# Patient Record
Sex: Male | Born: 1965 | Race: White | Hispanic: No | Marital: Single | Smoking: Current some day smoker
Health system: Southern US, Community
[De-identification: ages and names within clinical notes are randomized; demographics above are authoritative.]

## PROBLEM LIST (undated history)

## (undated) DIAGNOSIS — B182 Chronic viral hepatitis C: Secondary | ICD-10-CM

## (undated) DIAGNOSIS — K219 Gastro-esophageal reflux disease without esophagitis: Secondary | ICD-10-CM

## (undated) DIAGNOSIS — I10 Essential (primary) hypertension: Secondary | ICD-10-CM

## (undated) DIAGNOSIS — F32A Depression, unspecified: Secondary | ICD-10-CM

## (undated) DIAGNOSIS — K429 Umbilical hernia without obstruction or gangrene: Secondary | ICD-10-CM

## (undated) DIAGNOSIS — J069 Acute upper respiratory infection, unspecified: Secondary | ICD-10-CM

## (undated) DIAGNOSIS — R188 Other ascites: Secondary | ICD-10-CM

## (undated) DIAGNOSIS — K7460 Unspecified cirrhosis of liver: Secondary | ICD-10-CM

## (undated) DIAGNOSIS — F329 Major depressive disorder, single episode, unspecified: Secondary | ICD-10-CM

## (undated) HISTORY — PX: OTHER SURGICAL HISTORY: SHX169

## (undated) HISTORY — DX: Other ascites: R18.8

## (undated) HISTORY — DX: Chronic viral hepatitis C: B18.2

## (undated) HISTORY — DX: Unspecified cirrhosis of liver: K74.60

---

## 2008-07-15 ENCOUNTER — Emergency Department (HOSPITAL_COMMUNITY): Admission: EM | Admit: 2008-07-15 | Discharge: 2008-07-16 | Payer: Self-pay | Admitting: Emergency Medicine

## 2011-07-06 LAB — RAPID URINE DRUG SCREEN, HOSP PERFORMED
Amphetamines: NOT DETECTED
Barbiturates: NOT DETECTED
Benzodiazepines: POSITIVE — AB
Cocaine: NOT DETECTED

## 2011-07-06 LAB — POCT I-STAT, CHEM 8
Creatinine, Ser: 0.9
Glucose, Bld: 99
HCT: 51
Hemoglobin: 17.3 — ABNORMAL HIGH
Potassium: 3.6
Sodium: 139
TCO2: 22

## 2011-07-06 LAB — ETHANOL: Alcohol, Ethyl (B): <5

## 2011-07-06 LAB — DIFFERENTIAL
Basophils Absolute: 0.1
Eosinophils Relative: 0
Lymphocytes Relative: 22
Lymphs Abs: 2
Monocytes Absolute: 0.6
Neutro Abs: 6.4

## 2011-07-06 LAB — CBC
HCT: 48.5
Hemoglobin: 16.5
WBC: 9.2

## 2011-08-10 ENCOUNTER — Encounter (INDEPENDENT_AMBULATORY_CARE_PROVIDER_SITE_OTHER): Payer: Self-pay | Admitting: General Surgery

## 2011-08-10 ENCOUNTER — Other Ambulatory Visit (INDEPENDENT_AMBULATORY_CARE_PROVIDER_SITE_OTHER): Payer: Self-pay | Admitting: General Surgery

## 2011-08-10 ENCOUNTER — Ambulatory Visit (INDEPENDENT_AMBULATORY_CARE_PROVIDER_SITE_OTHER): Payer: Medicare PPO | Admitting: General Surgery

## 2011-08-10 VITALS — BP 158/96 | HR 64 | Temp 98.5°F | Resp 18 | Ht 72.0 in | Wt 225.0 lb

## 2011-08-10 DIAGNOSIS — K420 Umbilical hernia with obstruction, without gangrene: Secondary | ICD-10-CM

## 2011-08-10 NOTE — Progress Notes (Signed)
Subjective:     Patient ID: Joe Hayes, male   DOB: 14-Dec-1965, 45 y.o.   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 He went to the emergency room and aspirin a day with  a CT and then he was released and maybe get the week and get the radiologist to pull up the CT -- her so I can review it I do not have a copy of the x-ray report workup is of any lab   Review of Systems Allergies  Allergen Reactions  . Penicillins     hives   History reviewed. No pertinent past surgical history. Current Outpatient Prescriptions  Medication Sig Dispense Refill  . oxyCODONE (OXY IR/ROXICODONE) 5 MG immediate release tablet Take 5 mg by mouth every 4 (four) hours as needed.         BP 158/96  Pulse 64  Temp(Src) 98.5 F (36.9 C) (Temporal)  Resp 18  Ht 6' (1.829 m)  Wt 225 lb (102.059 kg)  BMI 30.52 kg/m2 The patient states he is still smoking he denies any stridor decrease his cigarette consumption and he drinks or regular basis but he says he's never had DTs     Objective:   Physical Exam Physical examination the patient's, rough looking muscular mildly overweight Caucasian male in no acute distress he is not coughing at this time and HEENT pupils are 3 unremarkable he's got decreased breath sounds at little bit of wheeze and occasional of a heavy cigarette user than he admits to. On abdominal exam he is not tender but he's had a small lemon-sized umbilical hernia that you can partially reduce it appears that there is a  defect right above the umbilicus off the supraumbilical component also. On rectal examination there is a moderate amount of stool is Hemoccult-negative in his rectum. Extremities unremarkable C&S is physiologic    Assessment:     Patient has a umbilical hernia this not completely reducible he is out of work for 2 weeks no because of s hernia and I'll recommend that we go ahead and plan on rng the hernia sometime in the near future at [facility] just plan on keeping him overnight since he has nodid be with him on the night of surgery. 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

## 2011-08-10 NOTE — H&P (Signed)
Patient ID: Joe Hayes, male   DOB: August 05, 1966, 45 y.o.   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 He went to the emergency room and aspirin a day with  a CT and then he was released and maybe get the week and get the radiologist to pull up the CT -- her so I can review it I do not have a copy of the x-ray report workup is of any lab   Review of Systems Allergies Allergen Reactions . Penicillins    hives  History reviewed. No pertinent past surgical history. Current Outpatient Prescriptions Medication Sig Dispense Refill . oxyCODONE (OXY IR/ROXICODONE) 5 MG immediate release tablet Take 5 mg by mouth every 4 (four) hours as needed.        BP 158/96  Pulse 64  Temp(Src) 98.5 F (36.9 C) (Temporal)  Resp 18  Ht 6' (1.829 m)  Wt 225 lb (102.059 kg)  BMI 30.52 kg/m2 The patient states he is still smoking he denies any stridor decrease his cigarette consumption and he drinks or regular basis but he says he's never had DTs     Objective:  Physical Exam Physical examination the patient's, rough looking muscular mildly overweight Caucasian male in no acute distress he is not coughing at this time and HEENT pupils are 3 unremarkable he's got decreased breath sounds at little bit of wheeze and occasional of a heavy cigarette user than he admits to. On abdominal exam he is not tender but he's had a small lemon-sized umbilical hernia that you can partially reduce it appears that there is a defect right above the  umbilicus off the supraumbilical component also. On rectal examination there is a moderate amount of stool is Hemoccult-negative in his rectum. Extremities unremarkable C&S is physiologic    Assessment:    Patient has a umbilical hernia this not completely reducible he is out of work for 2 weeks no because of s hernia and I'll recommend that we go ahead and plan on rng the hernia sometime in the near future at [facility] just plan on keeping him overnight since he has nodid be with him on the night of surgery. 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

## 2011-08-11 ENCOUNTER — Encounter (HOSPITAL_COMMUNITY): Payer: Self-pay | Admitting: Pharmacy Technician

## 2011-08-11 ENCOUNTER — Encounter (HOSPITAL_COMMUNITY): Payer: Self-pay | Admitting: *Deleted

## 2011-08-12 ENCOUNTER — Ambulatory Visit (HOSPITAL_COMMUNITY)
Admission: RE | Admit: 2011-08-12 | Discharge: 2011-08-12 | Disposition: A | Discharge status: Home / Self Care | Payer: 59 | Source: Ambulatory Visit | Attending: General Surgery | Admitting: General Surgery

## 2011-08-12 ENCOUNTER — Ambulatory Visit (HOSPITAL_COMMUNITY): Payer: 59 | Admitting: Anesthesiology

## 2011-08-12 ENCOUNTER — Other Ambulatory Visit: Payer: Self-pay

## 2011-08-12 ENCOUNTER — Encounter (HOSPITAL_COMMUNITY): Payer: Self-pay | Admitting: *Deleted

## 2011-08-12 ENCOUNTER — Ambulatory Visit (HOSPITAL_COMMUNITY): Payer: 59

## 2011-08-12 ENCOUNTER — Encounter (HOSPITAL_COMMUNITY): Payer: Self-pay | Admitting: Anesthesiology

## 2011-08-12 ENCOUNTER — Encounter (HOSPITAL_COMMUNITY)
Admission: RE | Disposition: A | Discharge status: Home / Self Care | Payer: Self-pay | Source: Ambulatory Visit | Attending: General Surgery

## 2011-08-12 DIAGNOSIS — K429 Umbilical hernia without obstruction or gangrene: Secondary | ICD-10-CM | POA: Insufficient documentation

## 2011-08-12 DIAGNOSIS — Z01810 Encounter for preprocedural cardiovascular examination: Secondary | ICD-10-CM | POA: Insufficient documentation

## 2011-08-12 DIAGNOSIS — Z01818 Encounter for other preprocedural examination: Secondary | ICD-10-CM | POA: Insufficient documentation

## 2011-08-12 DIAGNOSIS — Z01812 Encounter for preprocedural laboratory examination: Secondary | ICD-10-CM | POA: Insufficient documentation

## 2011-08-12 HISTORY — DX: Depression, unspecified: F32.A

## 2011-08-12 HISTORY — DX: Gastro-esophageal reflux disease without esophagitis: K21.9

## 2011-08-12 HISTORY — DX: Essential (primary) hypertension: I10

## 2011-08-12 HISTORY — DX: Umbilical hernia without obstruction or gangrene: K42.9

## 2011-08-12 HISTORY — DX: Major depressive disorder, single episode, unspecified: F32.9

## 2011-08-12 HISTORY — PX: UMBILICAL HERNIA REPAIR: SHX196

## 2011-08-12 HISTORY — DX: Acute upper respiratory infection, unspecified: J06.9

## 2011-08-12 LAB — CBC
HCT: 47.1 % (ref 39.0–52.0)
MCHC: 35 g/dL (ref 30.0–36.0)
MCV: 94.4 fL (ref 78.0–100.0)
Platelets: 177 10*3/uL (ref 150–400)
RDW: 14.1 % (ref 11.5–15.5)
WBC: 8.9 10*3/uL (ref 4.0–10.5)

## 2011-08-12 LAB — COMPREHENSIVE METABOLIC PANEL
AST: 56 U/L — ABNORMAL HIGH (ref 0–37)
Albumin: 3.7 g/dL (ref 3.5–5.2)
BUN: 15 mg/dL (ref 6–23)
Calcium: 9.4 mg/dL (ref 8.4–10.5)
Creatinine, Ser: 0.83 mg/dL (ref 0.50–1.35)
GFR calc Af Amer: >90 mL/min (ref >90)
GFR calc non Af Amer: >90 mL/min (ref >90)
Total Protein: 8.3 g/dL (ref 6.0–8.3)

## 2011-08-12 LAB — DIFFERENTIAL
Basophils Absolute: 0 10*3/uL (ref 0.0–0.1)
Eosinophils Absolute: 0.1 10*3/uL (ref 0.0–0.7)
Eosinophils Relative: 1 % (ref 0–5)

## 2011-08-12 SURGERY — REPAIR, HERNIA, UMBILICAL, ADULT
Anesthesia: General | Site: Abdomen | Wound class: Clean

## 2011-08-12 MED ORDER — MIDAZOLAM HCL 5 MG/5ML IJ SOLN
INTRAMUSCULAR | Status: DC | PRN
  Administered 2011-08-12: 2 mg via INTRAVENOUS

## 2011-08-12 MED ORDER — BACITRACIN-NEOMYCIN-POLYMYXIN 400-5-5000 EX OINT
TOPICAL_OINTMENT | CUTANEOUS | Status: AC
  Filled 2011-08-12: qty 1

## 2011-08-12 MED ORDER — SODIUM CHLORIDE 0.9 % IR SOLN
Status: DC | PRN
  Administered 2011-08-12: 1000 mL

## 2011-08-12 MED ORDER — HYDROMORPHONE HCL PF 1 MG/ML IJ SOLN
0.2500 mg | INTRAMUSCULAR | Status: DC | PRN
  Administered 2011-08-12: 0.5 mg via INTRAVENOUS
  Administered 2011-08-12 (×2): 0.25 mg via INTRAVENOUS

## 2011-08-12 MED ORDER — BUPIVACAINE-EPINEPHRINE PF 0.25-1:200000 % IJ SOLN
INTRAMUSCULAR | Status: AC
  Filled 2011-08-12: qty 30

## 2011-08-12 MED ORDER — ONDANSETRON HCL 4 MG/2ML IJ SOLN
INTRAMUSCULAR | Status: DC | PRN
  Administered 2011-08-12: 4 mg via INTRAVENOUS

## 2011-08-12 MED ORDER — HYDROMORPHONE HCL PF 1 MG/ML IJ SOLN
INTRAMUSCULAR | Status: AC
  Filled 2011-08-12: qty 1

## 2011-08-12 MED ORDER — ACETAMINOPHEN 10 MG/ML IV SOLN
INTRAVENOUS | Status: DC | PRN
  Administered 2011-08-12: 1000 mg via INTRAVENOUS

## 2011-08-12 MED ORDER — ACETAMINOPHEN 10 MG/ML IV SOLN
INTRAVENOUS | Status: AC
  Filled 2011-08-12: qty 100

## 2011-08-12 MED ORDER — FENTANYL CITRATE 0.05 MG/ML IJ SOLN: 25.0000 ug | INTRAMUSCULAR | Status: DC | PRN

## 2011-08-12 MED ORDER — LACTATED RINGERS IV SOLN
INTRAVENOUS | Status: DC
  Administered 2011-08-12: 1000 mL via INTRAVENOUS

## 2011-08-12 MED ORDER — SUCCINYLCHOLINE CHLORIDE 20 MG/ML IJ SOLN
INTRAMUSCULAR | Status: DC | PRN
  Administered 2011-08-12: 100 mg via INTRAVENOUS

## 2011-08-12 MED ORDER — BUPIVACAINE-EPINEPHRINE PF 0.25-1:200000 % IJ SOLN
INTRAMUSCULAR | Status: DC | PRN
  Administered 2011-08-12: 30 mL

## 2011-08-12 MED ORDER — DEXAMETHASONE SODIUM PHOSPHATE 10 MG/ML IJ SOLN
INTRAMUSCULAR | Status: DC | PRN
  Administered 2011-08-12: 10 mg via INTRAVENOUS

## 2011-08-12 MED ORDER — NEOSTIGMINE METHYLSULFATE 1 MG/ML IJ SOLN
INTRAMUSCULAR | Status: DC | PRN
  Administered 2011-08-12: 2 mg via INTRAVENOUS

## 2011-08-12 MED ORDER — FENTANYL CITRATE 0.05 MG/ML IJ SOLN
INTRAMUSCULAR | Status: AC
  Filled 2011-08-12: qty 2

## 2011-08-12 MED ORDER — LABETALOL HCL 5 MG/ML IV SOLN
INTRAVENOUS | Status: DC | PRN
  Administered 2011-08-12: 2.5 mg via INTRAVENOUS
  Administered 2011-08-12: 5 mg via INTRAVENOUS

## 2011-08-12 MED ORDER — FENTANYL CITRATE 0.05 MG/ML IJ SOLN
50.0000 ug | INTRAMUSCULAR | Status: DC | PRN
  Administered 2011-08-12 (×2): 50 ug via INTRAVENOUS

## 2011-08-12 MED ORDER — CIPROFLOXACIN IN D5W 400 MG/200ML IV SOLN
INTRAVENOUS | Status: AC
  Filled 2011-08-12: qty 200

## 2011-08-12 MED ORDER — HYDROCODONE-ACETAMINOPHEN 5-325 MG PO TABS
ORAL_TABLET | ORAL | Status: AC
  Administered 2011-08-12: 1 via ORAL
  Filled 2011-08-12: qty 1

## 2011-08-12 MED ORDER — HYDRALAZINE HCL 20 MG/ML IJ SOLN
INTRAMUSCULAR | Status: DC | PRN
  Administered 2011-08-12: 4 mg via INTRAVENOUS

## 2011-08-12 MED ORDER — GLYCOPYRROLATE 0.2 MG/ML IJ SOLN
INTRAMUSCULAR | Status: DC | PRN
  Administered 2011-08-12: .4 mg via INTRAVENOUS

## 2011-08-12 MED ORDER — ROCURONIUM BROMIDE 100 MG/10ML IV SOLN
INTRAVENOUS | Status: DC | PRN
  Administered 2011-08-12: 10 mg via INTRAVENOUS
  Administered 2011-08-12: 30 mg via INTRAVENOUS

## 2011-08-12 MED ORDER — FENTANYL CITRATE 0.05 MG/ML IJ SOLN
INTRAMUSCULAR | Status: DC | PRN
  Administered 2011-08-12 (×7): 50 ug via INTRAVENOUS

## 2011-08-12 MED ORDER — PROPOFOL 10 MG/ML IV EMUL
INTRAVENOUS | Status: DC | PRN
  Administered 2011-08-12: 200 mg via INTRAVENOUS

## 2011-08-12 MED ORDER — PROMETHAZINE HCL 25 MG/ML IJ SOLN: 6.2500 mg | INTRAMUSCULAR | Status: DC | PRN

## 2011-08-12 MED ORDER — CIPROFLOXACIN IN D5W 400 MG/200ML IV SOLN
400.0000 mg | INTRAVENOUS | Status: AC
  Administered 2011-08-12: 400 mg via INTRAVENOUS
  Filled 2011-08-12: qty 200

## 2011-08-12 MED ORDER — LIDOCAINE HCL (CARDIAC) 20 MG/ML IV SOLN
INTRAVENOUS | Status: DC | PRN
  Administered 2011-08-12: 60 mg via INTRAVENOUS

## 2011-08-12 SURGICAL SUPPLY — 44 items
BENZOIN TINCTURE PRP APPL 2/3 (GAUZE/BANDAGES/DRESSINGS) ×2 IMPLANT
BLADE HEX COATED 2.75 (ELECTRODE) ×2 IMPLANT
BLADE SURG 15 STRL LF DISP TIS (BLADE) ×1 IMPLANT
BLADE SURG 15 STRL SS (BLADE) ×1
BLADE SURG SZ10 CARB STEEL (BLADE) IMPLANT
CLOTH BEACON ORANGE TIMEOUT ST (SAFETY) ×2 IMPLANT
COTTON BALL STERILE (GAUZE/BANDAGES/DRESSINGS)
COTTON BALL STERILE 2 PK (GAUZE/BANDAGES/DRESSINGS) IMPLANT
DECANTER SPIKE VIAL GLASS SM (MISCELLANEOUS) ×2 IMPLANT
DISSECTOR ROUND CHERRY 3/8 STR (MISCELLANEOUS) ×2 IMPLANT
DRAPE LAPAROSCOPIC ABDOMINAL (DRAPES) ×2 IMPLANT
ELECT REM PT RETURN 9FT ADLT (ELECTROSURGICAL) ×2
ELECTRODE REM PT RTRN 9FT ADLT (ELECTROSURGICAL) ×1 IMPLANT
GLOVE BIOGEL PI IND STRL 7.0 (GLOVE) ×1 IMPLANT
GLOVE BIOGEL PI INDICATOR 7.0 (GLOVE) ×1
GLOVE ORTHO TXT STRL SZ7.5 (GLOVE) ×2 IMPLANT
GOWN PREVENTION PLUS XLARGE (GOWN DISPOSABLE) ×2 IMPLANT
GOWN STRL NON-REIN LRG LVL3 (GOWN DISPOSABLE) ×2 IMPLANT
GOWN STRL REIN XL XLG (GOWN DISPOSABLE) ×2 IMPLANT
KIT BASIN OR (CUSTOM PROCEDURE TRAY) ×2 IMPLANT
NEEDLE HYPO 22GX1.5 SAFETY (NEEDLE) ×2 IMPLANT
PACK GENERAL/GYN (CUSTOM PROCEDURE TRAY) ×2 IMPLANT
PATCH VENTRAL MEDIUM 6.4 (Mesh Specialty) ×2 IMPLANT
SPONGE GAUZE 4X4 12PLY (GAUZE/BANDAGES/DRESSINGS) ×2 IMPLANT
SPONGE LAP 18X18 X RAY DECT (DISPOSABLE) ×2 IMPLANT
STRIP CLOSURE SKIN 1/2X4 (GAUZE/BANDAGES/DRESSINGS) ×2 IMPLANT
SUT ETHILON 4 0 PS 2 18 (SUTURE) ×2 IMPLANT
SUT PROLENE 0 CT 1 30 (SUTURE) IMPLANT
SUT PROLENE 0 CT 1 CR/8 (SUTURE) IMPLANT
SUT PROLENE 0 CT 2 (SUTURE) IMPLANT
SUT PROLENE 0 CTX CR/8 (SUTURE) ×2 IMPLANT
SUT SURG 0 T 19/GS 22 1969 62 (SUTURE) ×4 IMPLANT
SUT VIC AB 2-0 SH 18 (SUTURE) ×2 IMPLANT
SUT VIC AB 3-0 54XBRD REEL (SUTURE) ×1 IMPLANT
SUT VIC AB 3-0 BRD 54 (SUTURE) ×1
SUT VIC AB 3-0 SH 18 (SUTURE) ×2 IMPLANT
SUT VIC AB 3-0 SH 27 (SUTURE)
SUT VIC AB 3-0 SH 27XBRD (SUTURE) IMPLANT
SUT VIC AB 4-0 PS2 27 (SUTURE) ×2 IMPLANT
SUT VICRYL 2 0 18  UND BR (SUTURE)
SUT VICRYL 2 0 18 UND BR (SUTURE) IMPLANT
SYR BULB IRRIGATION 50ML (SYRINGE) IMPLANT
SYR CONTROL 10ML LL (SYRINGE) ×2 IMPLANT
TAPE CLOTH SURG 4X10 WHT LF (GAUZE/BANDAGES/DRESSINGS) ×2 IMPLANT

## 2011-08-12 NOTE — Op Note (Signed)
Umbilical Herniorrhaphy Procedure Note  Indications: Symptomatic umbilical hernia  Pre-operative Diagnosis: umbilical hernia  Post-operative Diagnosis: umbilical hernia  Surgeon: Iona Coach   Anesthesiologist: Gaetano Hawthorne, MD  Anesthesia: General endotracheal anesthesia  ASA Class: 1  Brief History:  Patient is a 45 year old moderately overweight male. 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  He said recently had an episode of coughing and that coworkers where he worked had the flu, and he got sick with coughing and upper respiratory type problems.  Despite his umbilical hernia with multiple symptoms, patient states he's never had a colonoscopy.  On examination in office, I cannot find any blood in his stool on Hemoccult test. He had an emergency room visit and was placed on daily aspirin.  Procedure Details  The patient was seen in the Holding Room. The risks, benefits, complications, treatment options, and expected outcomes were discussed with the patient. The possibilities of reaction to medication, pulmonary aspiration, perforation of viscus, bleeding, recurrent infection, the need for additional procedures, failure to diagnose a condition, and creating a complication requiring transfusion or operation were discussed with the patient. The patient concurred with the proposed plan, giving informed consent.  The site of surgery properly noted/marked. The patient was taken to Operating Room # 11, identified as Joe Hayes and the procedure verified as Umbilical Herniorrhaphy. A Time Out was held and the above information confirmed.  The patient was placed supine.  After establishing general anesthesia, the abdomen was prepped and draped in standard fashion.  0.50% Marcaine with epinephrine was used to anesthetize the skin.  A periumbilical incision was created.  Dissection was carried down to the hernia sac located  above the fascia and was mobilized from surrounding structures.  Intact fascia was identified circumferentially around the defect.  He had a supraumbilical defect also with incarcerated omentum which was reduced, and the incarcerated omentum removed.  Pedicles tied with 2-0 vicryl.  A medium size Proceed ventral patch was used. It was sutured at edges with 0 prolene interrupted sutures. The mesh was under all the fascia defects. The umbilical defect was closed with 0 surgelon sutures.   The soft tissue was irrigated and closed in layers.  Hemostasis was confirmed.  The skin incision was closed in layers with a 4-0 Vicryl subcuticular closure and 4-0 nylon sutures.    Instrument, sponge, and needle counts were correct prior to closure and at the conclusion of the case.   Estimated Blood Loss:  Minimal                 Specimens: Tissue trimmings, not sent to pathology         Implants:   Implant Name Type Inv. Item Serial No. Manufacturer Lot No. LRB No. Used  PATCH VENTRAL MEDIUM 6.4 - [id number] [employer] Specialty PATCH VENTRAL MEDIUM 6.4     [id number] N/A 1   Complications:  None; patient tolerated the procedure well.         Disposition: PACU - hemodynamically stable.         Condition: stable  Attending Attestation: I was present and scrubbed for the entire procedure.

## 2011-08-12 NOTE — Preoperative (Signed)
Beta Blockers   Reason not to administer Beta Blockers:Not Applicable 

## 2011-08-12 NOTE — Interval H&P Note (Signed)
History and Physical Interval Note:   08/12/2011   12:53 PM   Joe Hayes  has presented today for surgery, with the diagnosis of incarerated umbilical hernia  The various methods of treatment have been discussed with the patient. After consideration of risks, benefits and other options for treatment, the patient has consented to  Procedure(s): HERNIA REPAIR UMBILICAL ADULT as a surgical intervention .  The patients' history has been reviewed, patient examined, no change in status, stable for surgery.  I have reviewed the patients' chart and labs.  Questions were answered to the patient's satisfaction.   Hernia still incarcareted, no generalized abd pain. Chest xray done  BP 158/116  Pulse 90  Temp 97.8 F (36.6 C)  Resp 20  Ht 6' (1.829 m)  Wt 219 lb 4 oz (99.451 kg)  BMI 29.74 kg/m2  SpO2 97%   Iona Coach  MD

## 2011-08-12 NOTE — Progress Notes (Signed)
Short stay complete, pt transferred to Cxray then to OR holding

## 2011-08-12 NOTE — Anesthesia Preprocedure Evaluation (Addendum)
Anesthesia Evaluation  Patient identified by MRN, date of birth, ID band Patient awake    Reviewed: Allergy & Precautions, H&P , NPO status   History of Anesthesia Complications Negative for: history of anesthetic complications  Airway Mallampati: III TM Distance: >3 FB Neck ROM: Full    Dental  (+) Chipped and Dental Advisory Given,    Pulmonary Recent URI , Resolved, Current Smoker,    Pulmonary exam normal       Cardiovascular hypertension, - anginaneg cardio ROS     Neuro/Psych Negative Neurological ROS  Negative Psych ROS   GI/Hepatic negative GI ROS, Neg liver ROS, GERD-  Controlled,  Endo/Other  Negative Endocrine ROS  Renal/GU negative Renal ROS  Genitourinary negative   Musculoskeletal negative musculoskeletal ROS (+)   Abdominal Normal abdominal exam  (+)   Peds negative pediatric ROS (+)  Hematology negative hematology ROS (+)   Anesthesia Other Findings   Reproductive/Obstetrics negative OB ROS                        Anesthesia Physical Anesthesia Plan  ASA: II  Anesthesia Plan: General   Post-op Pain Management:    Induction: Intravenous  Airway Management Planned: Oral ETT  Additional Equipment:   Intra-op Plan:   Post-operative Plan:   Informed Consent: I have reviewed the patients History and Physical, chart, labs and discussed the procedure including the risks, benefits and alternatives for the proposed anesthesia with the patient or authorized representative who has indicated his/her understanding and acceptance.   Dental advisory given  Plan Discussed with: CRNA and Surgeon  Anesthesia Plan Comments:         Anesthesia Quick Evaluation

## 2011-08-12 NOTE — H&P (View-Only) (Signed)
Patient ID: Joe Hayes, male   DOB: 05/13/1966, 45 y.o.   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 He went to the emergency room and aspirin a day with  a CT and then he was released and maybe get the week and get the radiologist to pull up the CT -- her so I can review it I do not have a copy of the x-ray report workup is of any lab   Review of Systems Allergies Allergen Reactions . Penicillins    hives  History reviewed. No pertinent past surgical history. Current Outpatient Prescriptions Medication Sig Dispense Refill . oxyCODONE (OXY IR/ROXICODONE) 5 MG immediate release tablet Take 5 mg by mouth every 4 (four) hours as needed.        BP 158/96  Pulse 64  Temp(Src) 98.5 F (36.9 C) (Temporal)  Resp 18  Ht 6' (1.829 m)  Wt 225 lb (102.059 kg)  BMI 30.52 kg/m2 The patient states he is still smoking he denies any stridor decrease his cigarette consumption and he drinks or regular basis but he says he's never had DTs     Objective:  Physical Exam Physical examination the patient's, rough looking muscular mildly overweight Caucasian male in no acute distress he is not coughing at this time and HEENT pupils are 3 unremarkable he's got decreased breath sounds at little bit of wheeze and occasional of a heavy cigarette user than he admits to. On abdominal exam he is not tender but he's had a small lemon-sized umbilical hernia that you can partially reduce it appears that there is a defect right above the  umbilicus off the supraumbilical component also. On rectal examination there is a moderate amount of stool is Hemoccult-negative in his rectum. Extremities unremarkable C&S is physiologic    Assessment:    Patient has a umbilical hernia this not completely reducible he is out of work for 2 weeks no because of s hernia and I'll recommend that we go ahead and plan on rng the hernia sometime in the near future at [place] just plan on keeping him overnight since he has nodid be with him on the night of surgery. 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       

## 2011-08-12 NOTE — Addendum Note (Signed)
Addendum  created 08/12/11 1720 by Gaetano Hawthorne, MD   Modules edited:Orders

## 2011-08-12 NOTE — Transfer of Care (Signed)
Immediate Anesthesia Transfer of Care Note  Patient: Joe Hayes  Procedure(s) Performed:  HERNIA REPAIR UMBILICAL ADULT - repair umbilical hernia with mesh  Patient Location: PACU  Anesthesia Type: General  Level of Consciousness: awake, sedated, patient cooperative, lethargic and responds to stimulation  Airway & Oxygen Therapy: Patient Spontanous Breathing and Patient connected to face mask oxygen  Post-op Assessment: Report given to PACU RN, Post -op Vital signs reviewed and stable and Patient moving all extremities X 4  Post vital signs: Reviewed and stable  Complications: No apparent anesthesia complications

## 2011-08-12 NOTE — Anesthesia Postprocedure Evaluation (Signed)
  Anesthesia Post-op Note  Patient: Joe Hayes  Procedure(s) Performed:  HERNIA REPAIR UMBILICAL ADULT - repair umbilical hernia with mesh  Patient Location: PACU  Anesthesia Type: General  Level of Consciousness: awake and alert   Airway and Oxygen Therapy: Patient Spontanous Breathing  Post-op Pain: mild  Post-op Assessment: Post-op Vital signs reviewed, Patient's Cardiovascular Status Stable, Respiratory Function Stable, Patent Airway and No signs of Nausea or vomiting  Post-op Vital Signs: stable  Complications: No apparent anesthesia complications

## 2011-08-12 NOTE — Addendum Note (Signed)
Addendum  created 08/12/11 1713 by Gaetano Hawthorne, MD   Modules edited:Orders

## 2011-08-14 LAB — MRSA CULTURE

## 2011-08-16 ENCOUNTER — Encounter (HOSPITAL_COMMUNITY): Payer: Self-pay | Admitting: General Surgery

## 2011-08-19 ENCOUNTER — Encounter (INDEPENDENT_AMBULATORY_CARE_PROVIDER_SITE_OTHER): Payer: Self-pay | Admitting: General Surgery

## 2011-08-19 ENCOUNTER — Ambulatory Visit (INDEPENDENT_AMBULATORY_CARE_PROVIDER_SITE_OTHER): Payer: 59 | Admitting: General Surgery

## 2011-08-19 VITALS — BP 142/88 | HR 88 | Temp 98.3°F | Resp 12 | Ht 72.0 in | Wt 220.4 lb

## 2011-08-19 DIAGNOSIS — K429 Umbilical hernia without obstruction or gangrene: Secondary | ICD-10-CM

## 2011-08-19 NOTE — Progress Notes (Signed)
Subjective:     Patient ID: Joe Hayes, male   DOB: [birth year], 45 y.o.   MRN: [id number]  HPIMr. 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 patch and he had a little ischemic area of the overlying skin where it had this chronic course [place] and that appears to be healing satisfactory with just a little bit of superficial exfoliation of the overlying skin he doesn't need a renewal of his pain medication and will plan on seeing the after Thanksgiving I removed the sutures did place Steri-Strips and he should let her get to the area can shower daily starting tomorrow and good bowel movements .   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

## 2011-08-19 NOTE — Patient Instructions (Signed)
Only light dressing may shower daily replace steri strips prn

## 2011-09-06 ENCOUNTER — Encounter (INDEPENDENT_AMBULATORY_CARE_PROVIDER_SITE_OTHER): Payer: Self-pay | Admitting: General Surgery

## 2011-09-06 ENCOUNTER — Ambulatory Visit (INDEPENDENT_AMBULATORY_CARE_PROVIDER_SITE_OTHER): Payer: 59 | Admitting: General Surgery

## 2011-09-06 VITALS — BP 132/88 | HR 72 | Temp 97.9°F | Resp 18 | Ht 72.0 in | Wt 225.2 lb

## 2011-09-06 DIAGNOSIS — K429 Umbilical hernia without obstruction or gangrene: Secondary | ICD-10-CM | POA: Insufficient documentation

## 2011-09-06 NOTE — Progress Notes (Signed)
Patient ID: Joe Hayes, male   DOB: September 26, 1966, 45 y.o.   MRN: [id number] Mr. Sleight returns she's now 4 weeks following his umbilical hernia repair with a medium size ventral patch kit and I had seen him and remove the sutures but he had a little bit of thin skin were trimmed and port of the umbilicus and a little area has separated. He's got very dry scan all over his body especially in the wintertime and a scissor used to other cream to try to soften this up. There is no evidence of any problem with infection no drainage from the labile and he's got some return to work slips I think if long as he is not listed over 40 pounds he could return to work blood or whether his employer has worked for him in the month of December is, if he. I think after 1 January he'll be 2 months he can do anything and everything and he'll see [place] on a p.r.n. basis.  P.vs atient states that his bowels are working well his pain is much less and he is appreciative a wall we've done they will see him on a p.r.n. basis

## 2013-01-02 ENCOUNTER — Encounter: Payer: Self-pay | Admitting: Vascular Surgery

## 2013-01-02 ENCOUNTER — Other Ambulatory Visit: Payer: Self-pay

## 2013-01-02 ENCOUNTER — Other Ambulatory Visit: Payer: Self-pay | Admitting: *Deleted

## 2013-01-02 DIAGNOSIS — I83893 Varicose veins of bilateral lower extremities with other complications: Secondary | ICD-10-CM

## 2013-01-18 ENCOUNTER — Encounter: Payer: Self-pay | Admitting: Vascular Surgery

## 2013-01-22 ENCOUNTER — Encounter (INDEPENDENT_AMBULATORY_CARE_PROVIDER_SITE_OTHER): Payer: 59 | Admitting: *Deleted

## 2013-01-22 ENCOUNTER — Ambulatory Visit (INDEPENDENT_AMBULATORY_CARE_PROVIDER_SITE_OTHER): Payer: 59 | Admitting: Vascular Surgery

## 2013-01-22 ENCOUNTER — Encounter: Payer: Self-pay | Admitting: Vascular Surgery

## 2013-01-22 VITALS — BP 190/119 | HR 80 | Resp 20 | Ht 72.0 in | Wt 226.0 lb

## 2013-01-22 DIAGNOSIS — I83893 Varicose veins of bilateral lower extremities with other complications: Secondary | ICD-10-CM

## 2013-01-22 NOTE — Progress Notes (Signed)
Subjective:     Patient ID: Joe Hayes, male   DOB: 1965-10-24, 47 y.o.   MRN: [id number]  HPI this 47 year old male was referred for severe venous insufficiency both legs. He has had bulging varicosities in both legs over the past several years. He's had multiple episodes of bleeding from the right leg in the past 6 months. This has required trips to the emergency department and the blood has been spurting significantly he states. He has no history of DVT, thrombophlebitis, stasis ulcers, or other complications. He has had scaly skin most of his life in all his extremities with an unknown diagnosis. He does develop swelling in both legs as the day progresses. It is not wear long leg elastic compression stockings nor elevate his legs.  Past Medical History  Diagnosis Date  . Abdominal pain   . Recurrent upper respiratory infection (URI)     uri x 1 month- states improved  . GERD (gastroesophageal reflux disease)   . Hypertension     was on samples years ago- not diagnosed as hypertension  . Depression     states PTSD 4 years ago requiring counceling and states is doing ok  . Umbilical hernia without mention of obstruction or gangrene     History  Substance Use Topics  . Smoking status: Current Some Day Smoker -- 0.50 packs/day for 10 years    Types: Cigarettes  . Smokeless tobacco: Never Used  . Alcohol Use: 4.8 oz/week    8 Shots of liquor per week    Family History  Problem Relation Age of Onset  . Heart disease Father   . Cancer Paternal Uncle     pt unaware of what kind    Allergies  Allergen Reactions  . Penicillins Hives    Occurred in childhood.    Current outpatient prescriptions:HYDROcodone-acetaminophen (VICODIN) 5-500 MG per tablet, Ad lib., Disp: , Rfl:   BP 190/119  Pulse 80  Resp 20  Ht 6' (1.829 m)  Wt 226 lb (102.513 kg)  BMI 30.64 kg/m2  Body mass index is 30.64 kg/(m^2).         Review of Systems he denies chest pain does have a history  of orthopnea and leg discomfort while walking. Also has numbness and weakness in both arms and legs, bleeding problems, fever, chills,  Skin    Objective:   Physical Exam blood pressure 190 of 119 heart rate 80 respirations 20 Gen.-alert and oriented x3 in no apparent distress HEENT normal for age Lungs no rhonchi or wheezing Cardiovascular regular rhythm no murmurs carotid pulses 3+ palpable no bruits audible Abdomen soft nontender no palpable masses Musculoskeletal free of  major deformities Skin --very dry and scaly throughout including chest abdomen upper extremities and lower extremities.  Neurologic normal Lower extremities 3+ femoral and dorsalis pedis pulses palpable bilaterally with 1+ edema bilaterally. Bulging varicosities right leg anterior thigh extending into the lateral calf with evidence of previous bleeding from lateral calf and very superficial reticular veins. Multiple reticular veins surrounding ankle on right. No active ulcers. Left leg bulging varicosities medial thigh of the great saphenous system extending posteriorly into the popliteal fossa with chronic 1+ edema and no active ulcer.  Today I ordered bilateral lower extremity duplex scan which I reviewed and interpreted. There is gross reflux in both great saphenous systems supplying these bulging varicosities. The small saphenous veins are free of reflux bilaterally and there is no DVT.       Assessment:  Severe bilateral venous insufficiency with gross reflux bilateral great saphenous systems with bulging symptomatic varicosities history of multiple stab episodes of bleeding the right leg    Plan:     #1 long-leg elastic compression stockings 20-30 mm gradient #2 elevate legs daily #3 ibuprofen on a daily basis #4 return in 3 months. If no significant dramatic change in his situation he will need #1 laser ablation right great saphenous vein and greater than 20 stab phlebectomy followed by #2 laser ablation  left great saphenous vein with greater than 20 stab phlebectomy. He has further bleeding episodes from right leg he will be in touch with [place] and we will proceed at that time.

## 2013-04-23 ENCOUNTER — Ambulatory Visit: Payer: 59 | Admitting: Vascular Surgery

## 2013-04-24 ENCOUNTER — Ambulatory Visit (INDEPENDENT_AMBULATORY_CARE_PROVIDER_SITE_OTHER): Payer: 59 | Admitting: Vascular Surgery

## 2013-04-24 ENCOUNTER — Encounter: Payer: Self-pay | Admitting: Vascular Surgery

## 2013-04-24 VITALS — BP 158/114 | HR 88 | Resp 16 | Ht 72.0 in | Wt 227.0 lb

## 2013-04-24 DIAGNOSIS — I83893 Varicose veins of bilateral lower extremities with other complications: Secondary | ICD-10-CM

## 2013-04-24 NOTE — Progress Notes (Signed)
Subjective:     Patient ID: Joe Hayes, male   DOB: Oct 24, 1965, 47 y.o.   MRN: [id number]  HPI this 47 year old male returns for further followup regarding her severe bilateral venous insufficiency. He has had 2 bleeds in the right leg from varicosities in the lateral calf since his last visit 3 months ago. These have been quite active requiring compression dressings to eventually stop the bleeding. He has no history of DVT. He has tried long-leg elastic compression stockings, ibuprofen, and elevation with no improvement.  Past Medical History  Diagnosis Date  . Abdominal pain   . Recurrent upper respiratory infection (URI)     uri x 1 month- states improved  . GERD (gastroesophageal reflux disease)   . Hypertension     was on samples years ago- not diagnosed as hypertension  . Depression     states PTSD 4 years ago requiring counceling and states is doing ok  . Umbilical hernia without mention of obstruction or gangrene     History  Substance Use Topics  . Smoking status: Current Some Day Smoker -- 0.50 packs/day for 10 years    Types: Cigarettes  . Smokeless tobacco: Never Used  . Alcohol Use: 4.8 oz/week    8 Shots of liquor per week    Family History  Problem Relation Age of Onset  . Heart disease Father   . Cancer Paternal Uncle     pt unaware of what kind    Allergies  Allergen Reactions  . Penicillins Hives    Occurred in childhood.    Current outpatient prescriptions:HYDROcodone-acetaminophen (VICODIN) 5-500 MG per tablet, Ad lib., Disp: , Rfl:   BP 158/114  Pulse 88  Resp 16  Ht 6' (1.829 m)  Wt 227 lb (102.967 kg)  BMI 30.78 kg/m2  Body mass index is 30.78 kg/(m^2).           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 Area of previous bleed in the right lateral calf noted with small eschar overlying varix.     Assessment:     Severe bilateral venous insufficiency with history of multiple bleeds from varicosities in right lower extremity and pain and swelling in bilateral lower extremities-resistant to conservative measures    Plan:     Patient needs #1 laser ablation right great saphenous vein with greater than 20 stab phlebectomy and sclerotherapy of varix which has bled and #2 laser ablation left great saphenous vein with greater than 20 stab phlebectomy Will proceed with precertification to perform this in the near future

## 2013-05-14 ENCOUNTER — Other Ambulatory Visit: Payer: Self-pay | Admitting: *Deleted

## 2013-05-14 DIAGNOSIS — I83893 Varicose veins of bilateral lower extremities with other complications: Secondary | ICD-10-CM

## 2013-05-18 ENCOUNTER — Encounter: Payer: Self-pay | Admitting: Vascular Surgery

## 2013-05-21 ENCOUNTER — Encounter: Payer: Self-pay | Admitting: Vascular Surgery

## 2013-05-21 ENCOUNTER — Ambulatory Visit (INDEPENDENT_AMBULATORY_CARE_PROVIDER_SITE_OTHER): Payer: 59 | Admitting: Vascular Surgery

## 2013-05-21 ENCOUNTER — Telehealth: Payer: Self-pay | Admitting: *Deleted

## 2013-05-21 VITALS — BP 170/104 | HR 90 | Resp 18 | Ht 72.0 in | Wt 226.0 lb

## 2013-05-21 DIAGNOSIS — I83893 Varicose veins of bilateral lower extremities with other complications: Secondary | ICD-10-CM

## 2013-05-21 NOTE — Progress Notes (Signed)
Laser Ablation Procedure      Date: 05/21/2013    TORRI LANGSTON DOB:[birth year]  Consent signed: Yes  Surgeon:J.D. Hart [place]  Procedure: Laser Ablation: right Greater Saphenous Vein  BP 170/104  Pulse 90  Resp 18  Ht 6' (1.829 m)  Wt 226 lb (102.513 kg)  BMI 30.64 kg/m2  Start time: 1:00   End time: 2:20  Tumescent Anesthesia: 400 cc 0.9% NaCl with 50 cc Lidocaine HCL with 1% Epi and 15 cc 8.4% NaHCO3  Local Anesthesia: 11 cc Lidocaine HCL and NaHCO3 (ratio 2:1)  Pulsed mode, 15 watts, delay, 1.0 duration 1541 total energy, 103 total pulses, 1:43 total time   Sclerotherapy: .3 %Sotradecol. Patient received a total of 6 cc foam  Stab Phlebectomy: >20 Sites: Thigh and Calf  Patient tolerated procedure well: Yes  Notes: Daughter reported that the patient was inebriated. Patient appeared to be alert and aware so we proceeded with the procedures.  Description of Procedure:  After marking the course of the saphenous vein and the secondary varicosities in the standing position, the patient was placed on the operating table in the supine position, and the right leg was prepped and draped in sterile fashion. Local anesthetic was administered, and under ultrasound guidance the saphenous vein was accessed with a micro needle and guide wire; then the micro puncture sheath was placed. A guide wire was inserted to the saphenofemoral junction, followed by a 5 french sheath.  The position of the sheath and then the laser fiber below the junction was confirmed using the ultrasound and visualization of the aiming beam.  Tumescent anesthesia was administered along the course of the saphenous vein using ultrasound guidance. Protective laser glasses were placed on the patient, and the laser was fired at 15 watts pulsed mode.  For a total of 1541 joules.  A steri strip was applied to the puncture site.  The patient was then put into Trendelenburg position.  Local anesthetic was utilized overlying  the marked varicosities.  Greater than 20 stab wounds were made using the tip of an 11 blade; and using the vein hook,  The phlebectomies were performed using a hemostat to avulse these varicosities.  Adequate hemostasis was achieved, and steri strips were applied to the stab wound.    Sclerotherapy was performed to two vessels using 6  cc .3% Sotradecol foam via a 27g butterfly needle.  ABD pads and thigh high compression stockings were applied.  Ace wrap bandages were applied over the phlebectomy sites and at the top of the saphenofemoral junction.  Blood loss was less than 15 cc.  The patient ambulated out of the operating room having tolerated the procedure well.

## 2013-05-21 NOTE — Progress Notes (Signed)
Subjective:     Patient ID: INES WARF, male   DOB: [birth year], 47 y.o.   MRN: [id number]  HPI this 47 year old male had laser ablation of the right great saphenous vein performed from the mid thigh to the saphenofemoral junction plus greater than 20 stab phlebectomy of secondary painful varicosities in the thigh lateral calf and medial calf area. He also had sclerotherapy of one isolated lateral calf varix which had bled on multiple occasions. A total of 1500 J of energy was utilized and he tolerated the procedure well which was performed under local tumescent anesthesia.   Review of Systems     Objective:   Physical Exam BP 170/104  Pulse 90  Resp 18  Ht 6' (1.829 m)  Wt 226 lb (102.513 kg)  BMI 30.64 kg/m2       Assessment:     Well-tolerated laser ablation right great saphenous vein plus greater than 20 stab phlebectomy and sclerotherapy of bleeding varix performed under local tumescent anesthesia    Plan:     Return in one week for venous duplex exam to confirm closure right great saphenous vein

## 2013-05-21 NOTE — Telephone Encounter (Signed)
Spoke with Joe Hayes daughter regarding bleeding/oozing right leg (thigh).  She states the oozing has not come through the compression dressing.  Joe Hayes is s/p endovenous laser ablation right greater saphenous vein and stab phlebectomy >20 Incisions right leg today, 05-21-2013 by Betti Cruz MD.  Instructed Joe Hayes's daughter to have him keep his leg elevated and do quiet, calm activities this afternoon and tonight (read, sleep, watch TV).  Instructed his daughter if bleeding increased and became heavy to get him to lay down, elevate his leg above his heart, and apply direct compression to the area of oozing until the bleeding ceased and then apply extra padding/ace wrap over the top of the existing compression dressing. Joe Hayes daughter verbalized understanding of instructions and will notify [facility] for further questions or concerns.

## 2013-05-22 ENCOUNTER — Telehealth: Payer: Self-pay | Admitting: *Deleted

## 2013-05-22 ENCOUNTER — Encounter: Payer: Self-pay | Admitting: Vascular Surgery

## 2013-05-22 NOTE — Telephone Encounter (Signed)
Left a message on his ans machine.

## 2013-05-23 ENCOUNTER — Telehealth: Payer: Self-pay | Admitting: *Deleted

## 2013-05-23 NOTE — Telephone Encounter (Signed)
Mr. Joe Hayes has dental appointment at [facility] today and wants to take compression dressing off and get shower prior to appointment.  Instructed Joe Hayes to remove compression dressing prior to showering but to keep the steri strips on for 1-2 weeks.  Instructed him after showering to put on thigh high compression hose and wear the hose until bedtime.  Reminded him that for the next 2 weeks he would need to wear the compression hose daytime.  Joe Hayes verbalized understanding of these instructions.

## 2013-05-28 ENCOUNTER — Encounter: Payer: Self-pay | Admitting: Vascular Surgery

## 2013-05-29 ENCOUNTER — Encounter: Payer: Self-pay | Admitting: Vascular Surgery

## 2013-05-29 ENCOUNTER — Ambulatory Visit (INDEPENDENT_AMBULATORY_CARE_PROVIDER_SITE_OTHER): Payer: 59 | Admitting: Vascular Surgery

## 2013-05-29 ENCOUNTER — Encounter (INDEPENDENT_AMBULATORY_CARE_PROVIDER_SITE_OTHER): Payer: 59 | Admitting: *Deleted

## 2013-05-29 VITALS — BP 130/87 | HR 89 | Resp 16 | Ht 72.0 in | Wt 226.0 lb

## 2013-05-29 DIAGNOSIS — I83893 Varicose veins of bilateral lower extremities with other complications: Secondary | ICD-10-CM

## 2013-05-29 NOTE — Progress Notes (Signed)
Subjective:     Patient ID: Joe Hayes, male   DOB: [birth year], 47 y.o.   MRN: [id number]  HPI this 47 year old male returns 1 week post laser ablation right great saphenous vein with greater than 20 stab phlebectomy and sclerotherapy of a bleeding site in the lower leg. He has had a moderate amount of discomfort particularly in the inguinal area where the laser ablation was performed. He has had no distal edema. There's been no recurrent bleeding from the previous bleeding site.   Review of Systems     Objective:   Physical Exam BP 130/87  Pulse 89  Resp 16  Ht 6' (1.829 m)  Wt 226 lb (102.513 kg)  BMI 30.64 kg/m2  General well-developed well-nourished male no apparent stress alert and oriented x3 Right leg with mild/moderate tenderness along the course of great saphenous vein from midthigh to saphenofemoral junction. Stab phlebectomy sites are healing nicely.  Today I ordered a venous duplex exam which I reviewed and interpreted. Right great saphenous vein is totally occluded and there is no DVT.     Assessment:     Successful laser ablation right great saphenous vein with greater than 20 stab phlebectomy and sclerotherapy for bleeding site Plan laser ablation left great saphenous vein with greater than 20 stab phlebectomy about 4 weeks from now    Plan:

## 2013-06-11 ENCOUNTER — Other Ambulatory Visit: Payer: Self-pay | Admitting: *Deleted

## 2013-06-11 DIAGNOSIS — I83893 Varicose veins of bilateral lower extremities with other complications: Secondary | ICD-10-CM

## 2013-06-15 ENCOUNTER — Encounter: Payer: Self-pay | Admitting: Vascular Surgery

## 2013-06-18 ENCOUNTER — Encounter: Payer: Self-pay | Admitting: Vascular Surgery

## 2013-06-18 ENCOUNTER — Ambulatory Visit (INDEPENDENT_AMBULATORY_CARE_PROVIDER_SITE_OTHER): Payer: 59 | Admitting: Vascular Surgery

## 2013-06-18 VITALS — BP 155/76 | HR 76 | Resp 18 | Ht 72.0 in | Wt 226.0 lb

## 2013-06-18 DIAGNOSIS — I83893 Varicose veins of bilateral lower extremities with other complications: Secondary | ICD-10-CM

## 2013-06-18 NOTE — Addendum Note (Signed)
Addended by: Josephina Gip D on: 06/18/2013 04:14 PM   Modules accepted: Level of Service

## 2013-06-18 NOTE — Progress Notes (Signed)
Subjective:     Patient ID: Joe Hayes, male   DOB: [birth year], 47 y.o.   MRN: [id number]  HPI this 47 year old male had laser ablation of the left great saphenous vein with greater than 20 stab phlebectomy performed under local tumescent anesthesia for painful varicosities. A total of 2020 J of energy was utilized. He tolerated the procedure well.  Review of Systems     Objective:   Physical Exam BP 155/76  Pulse 76  Resp 18  Ht 6' (1.829 m)  Wt 226 lb (102.513 kg)  BMI 30.64 kg/m2        Assessment:     Well-tolerated laser ablation left great saphenous vein from distal thigh the saphenofemoral junction plus greater than 20 stab phlebectomy painful varicosities performed under local tumescent anesthesia    Plan:     Return in one week for venous duplex exam to confirm closure left great saphenous

## 2013-06-18 NOTE — Progress Notes (Deleted)
Subjective:     Patient ID: Joe Hayes, male   DOB: 02-Feb-1966, 47 y.o.   MRN: [id number]  HPI this 47 year old male returns for continued followup regarding her severe venous insufficiency of both lower extremities. He has aching throbbing and burning discomfort in the thigh and calf regions bilaterally and a strut long leg elastic compression stockings 20-30 mm gradient. He works as a Therapist, music for [employer]  and is on his feet most of the day. He has tried ibuprofen but is unable to elevate his legs during the day. He continues to have severe symptoms.  Past Medical History  Diagnosis Date  . Abdominal pain   . Recurrent upper respiratory infection (URI)     uri x 1 month- states improved  . GERD (gastroesophageal reflux disease)   . Hypertension     was on samples years ago- not diagnosed as hypertension  . Depression     states PTSD 4 years ago requiring counceling and states is doing ok  . Umbilical hernia without mention of obstruction or gangrene     History  Substance Use Topics  . Smoking status: Current Some Day Smoker -- 0.50 packs/day for 10 years    Types: Cigarettes  . Smokeless tobacco: Never Used  . Alcohol Use: 4.8 oz/week    8 Shots of liquor per week    Family History  Problem Relation Age of Onset  . Heart disease Father   . Cancer Paternal Uncle     pt unaware of what kind    Allergies  Allergen Reactions  . Penicillins Hives    Occurred in childhood.    Current outpatient prescriptions:HYDROcodone-acetaminophen (VICODIN) 5-500 MG per tablet, Ad lib., Disp: , Rfl:   BP 155/76  Pulse 76  Resp 18  Ht 6' (1.829 m)  Wt 226 lb (102.513 kg)  BMI 30.64 kg/m2  Body mass index is 30.64 kg/(m^2).           Review of Systems denies chest pain or dyspnea on exertion but does have symptoms of reflux     Objective:   Physical Exam BP 155/76  Pulse 76  Resp 18  Ht 6' (1.829 m)  Wt 226 lb (102.513 kg)  BMI 30.64  kg/m2  General well-developed well-nourished male no apparent stress alert and oriented x3 Both lower extremity with bulging varicosities in the medial thigh medial calf and posterior calf on the left side with 1+ edema and early thickening of the skin bilaterally. No active ulcers.  Today a venous duplex exam of the right leg was performed which revealed gross reflux of the right great and right small saphenous vein supplying these bulging varicosities. Patient has already had demonstrated gross reflux in the left great saphenous vein     Assessment:     Severe venous insufficiency bilaterally with painful varicosities and distal edema affecting patient's daily living and ability to work    Plan:     He needs #1 laser ablation left great saphenous vein with greater than 20 stab phlebectomy followed by #2 laser ablation right great saphenous vein followed by #3 laser ablation right small saphenous vein with greater than 20 stab phlebectomy Will proceed with precertification to perform this in the near future

## 2013-06-18 NOTE — Progress Notes (Signed)
Laser Ablation Procedure      Date: 06/18/2013    Joe Hayes DOB:[birth year]  Consent signed: Yes  Surgeon:J.D. Hart [place]  Procedure: Laser Ablation: left Greater Saphenous Vein  BP 155/76  Pulse 76  Resp 18  Ht 6' (1.829 m)  Wt 226 lb (102.513 kg)  BMI 30.64 kg/m2  Start time: 3:10   End time: 4:20  Tumescent Anesthesia: 400 cc 0.9% NaCl with 50 cc Lidocaine HCL with 1% Epi and 15 cc 8.4% NaHCO3  Local Anesthesia: 7 cc Lidocaine HCL and NaHCO3 (ratio 2:1)  Pulsed mode: 15 watts, delay, 1.0 duration Total energy: 2020, total pulses 135, total time: 2:15    Stab Phlebectomy: >20 Sites: Thigh and Calf  Patient tolerated procedure well: Yes  Notes:   Description of Procedure:  After marking the course of the saphenous vein and the secondary varicosities in the standing position, the patient was placed on the operating table in the supine position, and the left leg was prepped and draped in sterile fashion. Local anesthetic was administered, and under ultrasound guidance the saphenous vein was accessed with a micro needle and guide wire; then the micro puncture sheath was placed. A guide wire was inserted to the saphenofemoral junction, followed by a 5 french sheath.  The position of the sheath and then the laser fiber below the junction was confirmed using the ultrasound and visualization of the aiming beam.  Tumescent anesthesia was administered along the course of the saphenous vein using ultrasound guidance. Protective laser glasses were placed on the patient, and the laser was fired at 15 watts pulsed mode  For a total of 2020 joules.  A steri strip was applied to the puncture site.  The patient was then put into Trendelenburg position.  Local anesthetic was utilized overlying the marked varicosities.  Greater than 20 stab wounds were made using the tip of an 11 blade; and using the vein hook,  The phlebectomies were performed using a hemostat to avulse these varicosities.   Adequate hemostasis was achieved, and steri strips were applied to the stab wound.      ABD pads and thigh high compression stockings were applied.  Ace wrap bandages were applied over the phlebectomy sites and at the top of the saphenofemoral junction.  Blood loss was less than 15 cc.  The patient ambulated out of the operating room having tolerated the procedure well.

## 2013-06-19 ENCOUNTER — Telehealth: Payer: Self-pay | Admitting: *Deleted

## 2013-06-19 ENCOUNTER — Encounter: Payer: Self-pay | Admitting: Vascular Surgery

## 2013-06-19 NOTE — Telephone Encounter (Signed)
Pt experiencing pain. Went over instruction again. Said he could loosen the ace wraps but reapply. He had no bleeding that he can see from the stab phleb sites. Reminded him of his fu appts next week.

## 2013-06-25 ENCOUNTER — Encounter: Payer: Self-pay | Admitting: Vascular Surgery

## 2013-06-26 ENCOUNTER — Encounter: Payer: Self-pay | Admitting: Vascular Surgery

## 2013-06-26 ENCOUNTER — Ambulatory Visit (INDEPENDENT_AMBULATORY_CARE_PROVIDER_SITE_OTHER): Payer: 59 | Admitting: Vascular Surgery

## 2013-06-26 ENCOUNTER — Encounter (INDEPENDENT_AMBULATORY_CARE_PROVIDER_SITE_OTHER): Payer: 59 | Admitting: *Deleted

## 2013-06-26 VITALS — BP 139/91 | HR 106 | Resp 18 | Ht 72.0 in | Wt 246.0 lb

## 2013-06-26 DIAGNOSIS — I83893 Varicose veins of bilateral lower extremities with other complications: Secondary | ICD-10-CM

## 2013-06-26 DIAGNOSIS — Z48812 Encounter for surgical aftercare following surgery on the circulatory system: Secondary | ICD-10-CM

## 2013-06-26 NOTE — Progress Notes (Signed)
Subjective:     Patient ID: ALOYSIUS HEINLE, male   DOB: 09-11-1966, 47 y.o.   MRN: [id number]  HPI 47 year old male returns 1 week post laser ablation left great saphenous vein with greater than 20 stab phlebectomy painful varicosities. He states he has had mild/moderate discomfort in the thigh area where the saphenous vein was closed but has had no change in the distal edema has been wearing his elastic compression stocking as instructed.  Review of Systems     Objective:   Physical Exam BP 139/91  Pulse 106  Resp 18  Ht 6' (1.829 m)  Wt 246 lb (111.585 kg)  BMI 33.36 kg/m2  General well-developed well-nourished male no apparent stress alert and oriented x3 Left leg with mild tenderness along the course of great saphenous vein from distal thigh the saphenofemoral junction. Minimal distal edema. Stab phlebectomy sites healing nicely.  Today I ordered a venous duplex exam of the left leg which are viewed and interpreted. The left great saphenous vein is totally closed and the distal thigh to near the saphenofemoral junction with no DVT     Assessment:     Successful laser ablation left great saphenous vein with multiple stab phlebectomy of painful varicosities    Plan:     This completes his treatment regimen-return on a when necessary basis

## 2013-08-16 ENCOUNTER — Telehealth: Payer: Self-pay | Admitting: *Deleted

## 2013-08-16 NOTE — Telephone Encounter (Signed)
Patient called c/o "burning and stinging" in leg from laser ablation to left GSV 06/18/13.  He denies any warmth, pain or redden areas on leg and was not having any bleeding. Patient states that it's just an odd feeling and wanted to make certain it was ok. After speaking to Domenic Moras RN, I explained to patient if symptoms worsened to call and he voiced understanding of the instructions.

## 2015-04-16 ENCOUNTER — Ambulatory Visit (INDEPENDENT_AMBULATORY_CARE_PROVIDER_SITE_OTHER): Payer: 59 | Admitting: Podiatry

## 2015-04-16 ENCOUNTER — Ambulatory Visit (INDEPENDENT_AMBULATORY_CARE_PROVIDER_SITE_OTHER): Payer: 59

## 2015-04-16 ENCOUNTER — Encounter: Payer: Self-pay | Admitting: Podiatry

## 2015-04-16 VITALS — BP 126/78 | HR 94 | Resp 18

## 2015-04-16 DIAGNOSIS — M722 Plantar fascial fibromatosis: Secondary | ICD-10-CM

## 2015-04-16 MED ORDER — MELOXICAM 15 MG PO TABS: 15.0000 mg | ORAL_TABLET | Freq: Every day | ORAL | Status: AC

## 2015-04-16 NOTE — Progress Notes (Signed)
   Subjective:    Patient ID: Joe Hayes, male    DOB: 1966/05/10, 49 y.o.   MRN: [id number]  HPI BOTH OF MY HEELS ARE HURTING AND HAS BEEN FOR ABOUT 2 MONTHS AND THE LEFT IS THE WORST AND IS SORE AND TENDER, HURTS WITH PRESSURE AND SOME NUMBNESS, TINGLING AND HURTS IN THE AM This patient presents for evaluation of both heels.  He says his left heel is more painful than right heel but he has difficulty walking due to his left heel.  He says he has pain upon rising in the morning and standing from sitting position.  His pain has been present for 2 months but no self treatment has been afforded.  He gives history of venous problems both legs.  He presents for evaluation and treatment.   Review of Systems  Respiratory: Positive for cough.   Cardiovascular: Positive for leg swelling.  Musculoskeletal:       DIFFICULTY WALKING  All other systems reviewed and are negative.      Objective:   Physical Exam  Objective: Review of past medical history, medications, social history and allergies were performed.  Vascular: Dorsalis pedis and posterior tibial pulses were palpable B/L, capillary refill was  WNL B/L, temperature gradient was WNL B/L   Skin:  No signs of symptoms of infection or ulcers on both feet  Nails: appear healthy with no signs of mycosis or infections  Sensory: Semmes Weinstein monifilament WNL   Orthopedic: Orthopedic evaluation demonstrates all joints distal t ankle have full ROM without crepitus, muscle power WNL B/L.  Severe palpable pain at the insertion plantar fascia left foot greater than right.    Plantar Fasciitis left foot.  IE and xray.  Injection therapy left foot.  Mobic prescribed. Powersteps recommended.  RTC 10 days       Assessment & Plan:

## 2015-04-22 DIAGNOSIS — M79673 Pain in unspecified foot: Secondary | ICD-10-CM

## 2015-04-30 ENCOUNTER — Ambulatory Visit: Payer: 59 | Admitting: Podiatry

## 2015-06-22 ENCOUNTER — Emergency Department (HOSPITAL_COMMUNITY)
Admission: EM | Admit: 2015-06-22 | Discharge: 2015-06-22 | Disposition: A | Discharge status: Home / Self Care | Payer: 59 | Attending: Emergency Medicine | Admitting: Emergency Medicine

## 2015-06-22 ENCOUNTER — Emergency Department (HOSPITAL_COMMUNITY): Payer: 59

## 2015-06-22 ENCOUNTER — Encounter (HOSPITAL_COMMUNITY): Payer: Self-pay | Admitting: Emergency Medicine

## 2015-06-22 ENCOUNTER — Emergency Department (HOSPITAL_COMMUNITY)
Admission: EM | Admit: 2015-06-22 | Discharge: 2015-06-22 | Discharge status: Absent without leave | Payer: 59 | Source: Home / Self Care

## 2015-06-22 DIAGNOSIS — Z720 Tobacco use: Secondary | ICD-10-CM | POA: Diagnosis not present

## 2015-06-22 DIAGNOSIS — Z8709 Personal history of other diseases of the respiratory system: Secondary | ICD-10-CM | POA: Diagnosis not present

## 2015-06-22 DIAGNOSIS — I10 Essential (primary) hypertension: Secondary | ICD-10-CM | POA: Insufficient documentation

## 2015-06-22 DIAGNOSIS — K469 Unspecified abdominal hernia without obstruction or gangrene: Secondary | ICD-10-CM | POA: Diagnosis not present

## 2015-06-22 DIAGNOSIS — I83891 Varicose veins of right lower extremities with other complications: Secondary | ICD-10-CM | POA: Diagnosis not present

## 2015-06-22 DIAGNOSIS — Z880 Allergy status to penicillin: Secondary | ICD-10-CM | POA: Insufficient documentation

## 2015-06-22 DIAGNOSIS — Z791 Long term (current) use of non-steroidal anti-inflammatories (NSAID): Secondary | ICD-10-CM | POA: Diagnosis not present

## 2015-06-22 DIAGNOSIS — R55 Syncope and collapse: Secondary | ICD-10-CM | POA: Diagnosis present

## 2015-06-22 DIAGNOSIS — Z8659 Personal history of other mental and behavioral disorders: Secondary | ICD-10-CM | POA: Diagnosis not present

## 2015-06-22 LAB — CBC WITH DIFFERENTIAL/PLATELET
Basophils Absolute: 0 10*3/uL (ref 0.0–0.1)
Basophils Relative: 0 %
Eosinophils Absolute: 0.2 10*3/uL (ref 0.0–0.7)
Eosinophils Relative: 2 %
HCT: 36.9 % — ABNORMAL LOW (ref 39.0–52.0)
Hemoglobin: 11.8 g/dL — ABNORMAL LOW (ref 13.0–17.0)
Lymphocytes Relative: 36 %
Lymphs Abs: 3.4 10*3/uL (ref 0.7–4.0)
MCH: 31.4 pg (ref 26.0–34.0)
MCHC: 32 g/dL (ref 30.0–36.0)
MCV: 98.1 fL (ref 78.0–100.0)
Monocytes Absolute: 1 10*3/uL (ref 0.1–1.0)
Monocytes Relative: 11 %
Neutro Abs: 4.6 10*3/uL (ref 1.7–7.7)
Neutrophils Relative %: 51 %
Platelets: 158 10*3/uL (ref 150–400)
RBC: 3.76 MIL/uL — ABNORMAL LOW (ref 4.22–5.81)
RDW: 15 % (ref 11.5–15.5)
WBC: 9.2 10*3/uL (ref 4.0–10.5)

## 2015-06-22 LAB — TYPE AND SCREEN
ABO/RH(D): A POS
Antibody Screen: NEGATIVE

## 2015-06-22 LAB — I-STAT CG4 LACTIC ACID, ED
Lactic Acid, Venous: 3.89 mmol/L (ref 0.5–2.0)
Lactic Acid, Venous: 1.82 mmol/L (ref 0.5–2.0)

## 2015-06-22 LAB — COMPREHENSIVE METABOLIC PANEL
ALK PHOS: 67 U/L (ref 38–126)
ALT: 84 U/L — AB (ref 17–63)
ANION GAP: 8 (ref 5–15)
AST: 91 U/L — ABNORMAL HIGH (ref 15–41)
Albumin: 2.7 g/dL — ABNORMAL LOW (ref 3.5–5.0)
BILIRUBIN TOTAL: 0.5 mg/dL (ref 0.3–1.2)
BUN: 12 mg/dL (ref 6–20)
CALCIUM: 7.7 mg/dL — AB (ref 8.9–10.3)
CO2: 21 mmol/L — ABNORMAL LOW (ref 22–32)
CREATININE: 1.15 mg/dL (ref 0.61–1.24)
Chloride: 107 mmol/L (ref 101–111)
Glucose, Bld: 133 mg/dL — ABNORMAL HIGH (ref 65–99)
Potassium: 3.5 mmol/L (ref 3.5–5.1)
SODIUM: 136 mmol/L (ref 135–145)
TOTAL PROTEIN: 6.2 g/dL — AB (ref 6.5–8.1)

## 2015-06-22 LAB — ABO/RH: ABO/RH(D): A POS

## 2015-06-22 LAB — I-STAT CHEM 8, ED
BUN: 14 mg/dL (ref 6–20)
Calcium, Ion: 1.08 mmol/L — ABNORMAL LOW (ref 1.12–1.23)
Chloride: 104 mmol/L (ref 101–111)
Creatinine, Ser: 1.3 mg/dL — ABNORMAL HIGH (ref 0.61–1.24)
Glucose, Bld: 135 mg/dL — ABNORMAL HIGH (ref 65–99)
HCT: 39 % (ref 39.0–52.0)
Hemoglobin: 13.3 g/dL (ref 13.0–17.0)
Potassium: 3.5 mmol/L (ref 3.5–5.1)
Sodium: 142 mmol/L (ref 135–145)
TCO2: 19 mmol/L (ref 0–100)

## 2015-06-22 LAB — PROTIME-INR
INR: 1.25 (ref 0.00–1.49)
Prothrombin Time: 15.9 seconds — ABNORMAL HIGH (ref 11.6–15.2)

## 2015-06-22 LAB — CK: Total CK: 78 U/L (ref 49–397)

## 2015-06-22 MED ORDER — CYCLOBENZAPRINE HCL 10 MG PO TABS
10.0000 mg | ORAL_TABLET | Freq: Once | ORAL | Status: AC
  Administered 2015-06-22: 10 mg via ORAL
  Filled 2015-06-22: qty 1

## 2015-06-22 MED ORDER — SODIUM CHLORIDE 0.9 % IV BOLUS (SEPSIS)
2000.0000 mL | Freq: Once | INTRAVENOUS | Status: AC
  Administered 2015-06-22: 2000 mL via INTRAVENOUS

## 2015-06-22 MED ORDER — IBUPROFEN 800 MG PO TABS
800.0000 mg | ORAL_TABLET | Freq: Once | ORAL | Status: AC
  Administered 2015-06-22: 800 mg via ORAL
  Filled 2015-06-22: qty 1

## 2015-06-22 MED ORDER — LIDOCAINE-EPINEPHRINE (PF) 2 %-1:200000 IJ SOLN
10.0000 mL | Freq: Once | INTRAMUSCULAR | Status: DC
  Filled 2015-06-22: qty 20

## 2015-06-22 NOTE — ED Provider Notes (Signed)
CSN: [id number]     Arrival date & time 06/22/15  1610 History   First MD Initiated Contact with Patient 06/22/15 1618     Chief Complaint  Patient presents with  . Hypotension   Patient is a 49 y.o. male presenting with general illness. The history is provided by the patient. No language interpreter was used.  Illness Location:  RLE Quality:  Bleeding Severity:  Severe Onset quality:  Gradual Timing:  Intermittent Progression:  Worsening Chronicity:  New Context:  PMHx of HTN, GERD, and varicose veins s/p removal x2 years ago presenting w/ RLE bleeding. No problems with hemorrhaging from varicose veins are past 2 years until yesterday. Experienced spontaneous bleeding from right lower extremity ulcer thought to be varicose vein. Patient applied pressure dressing with cessation of bleeding. Discharge 2 hours prior to arrival and bleeding reactivated. Patient attempted applying dressing fell over. Friend found on floor with large amount of blood. Called EMS Associated symptoms: loss of consciousness   Associated symptoms: no abdominal pain, no chest pain, no cough, no headaches, no nausea, no shortness of breath and no vomiting     Past Medical History  Diagnosis Date  . Abdominal pain   . Recurrent upper respiratory infection (URI)     uri x 1 month- states improved  . GERD (gastroesophageal reflux disease)   . Hypertension     was on samples years ago- not diagnosed as hypertension  . Depression     states PTSD 4 years ago requiring counceling and states is doing ok  . Umbilical hernia without mention of obstruction or gangrene    Past Surgical History  Procedure Laterality Date  . None      no surgeries  . Umbilical hernia repair  08/12/2011    Procedure: HERNIA REPAIR UMBILICAL ADULT;  Surgeon: Iona Coach, MD;  Location: [facility];  Service: General;  Laterality: N/A;  repair umbilical hernia with mesh   Family History  Problem Relation Age of Onset  . Heart disease  Father   . Cancer Paternal Uncle     pt unaware of what kind   Social History  Substance Use Topics  . Smoking status: Current Some Day Smoker -- 0.50 packs/day for 10 years    Types: Cigarettes  . Smokeless tobacco: Never Used  . Alcohol Use: 4.8 oz/week    8 Shots of liquor per week    Review of Systems  Respiratory: Negative for cough and shortness of breath.   Cardiovascular: Negative for chest pain.  Gastrointestinal: Negative for nausea, vomiting and abdominal pain.  Skin: Positive for wound (RLE).  Neurological: Positive for loss of consciousness. Negative for headaches.  Hematological: Bruises/bleeds easily.  All other systems reviewed and are negative.   Allergies  Penicillins  Home Medications   Prior to Admission medications   Medication Sig Start Date End Date Taking? Authorizing Provider  HYDROcodone-acetaminophen (VICODIN) 5-500 MG per tablet Ad lib. 08/12/11   Historical Provider, MD  meloxicam (MOBIC) 15 MG tablet Take 1 tablet (15 mg total) by mouth daily. 04/16/15   Helane Gunther, DPM   BP 132/67 mmHg  Pulse 78  Temp(Src) 97.5 F (36.4 C) (Oral)  Resp 16  Ht 6' (1.829 m)  Wt 250 lb (113.399 kg)  BMI 33.90 kg/m2  SpO2 98%   Physical Exam  Constitutional: He is oriented to person, place, and time. No distress.  HENT:  Head: Normocephalic and atraumatic.  Eyes: Conjunctivae are normal. Pupils are equal, round, and  reactive to light.  Neck: Normal range of motion. Neck supple.  Cardiovascular: Normal rate, regular rhythm and intact distal pulses.   Heart rate 70s and 80s, SCP initially 90s/50s, following fluid chest dictation BP rechecked at 120s/60s, 2+ DP pulse on left, biphasic dopplerable pulse on left for removal pressure dressing and palpable DP pulse following overall pressure dressing  Pulmonary/Chest: Effort normal and breath sounds normal.  Abdominal: Soft. He exhibits distension. There is no tenderness.  Hernia and supraumbilical region   Musculoskeletal: Normal range of motion.  Neurological: He is alert and oriented to person, place, and time.  Skin: Skin is warm and dry. He is not diaphoretic.    ED Course  Procedures   Labs Review Labs Reviewed  CBC WITH DIFFERENTIAL/PLATELET - Abnormal; Notable for the following:    RBC 3.76 (*)    Hemoglobin 11.8 (*)    HCT 36.9 (*)    All other components within normal limits  COMPREHENSIVE METABOLIC PANEL - Abnormal; Notable for the following:    CO2 21 (*)    Glucose, Bld 133 (*)    Calcium 7.7 (*)    Total Protein 6.2 (*)    Albumin 2.7 (*)    AST 91 (*)    ALT 84 (*)    All other components within normal limits  PROTIME-INR - Abnormal; Notable for the following:    Prothrombin Time 15.9 (*)    All other components within normal limits  I-STAT CG4 LACTIC ACID, ED - Abnormal; Notable for the following:    Lactic Acid, Venous 3.89 (*)    All other components within normal limits  I-STAT CHEM 8, ED - Abnormal; Notable for the following:    Creatinine, Ser 1.30 (*)    Glucose, Bld 135 (*)    Calcium, Ion 1.08 (*)    All other components within normal limits  CK  I-STAT CG4 LACTIC ACID, ED  TYPE AND SCREEN  ABO/RH   Imaging Review Ct Head Wo Contrast  06/22/2015   CLINICAL DATA:  Status post fall with hitting posterior head.  EXAM: CT HEAD WITHOUT CONTRAST  TECHNIQUE: Contiguous axial images were obtained from the base of the skull through the vertex without intravenous contrast.  COMPARISON:  None.  FINDINGS: No mass effect or midline shift. No evidence of acute intracranial hemorrhage, or infarction. No abnormal extra-axial fluid collections. Gray-white matter differentiation is normal. Basal cisterns are preserved. Vascular calcifications are noted at the skullbase.  No depressed skull fractures. Partial opacification of the ethmoid air cells.  IMPRESSION: No acute intracranial abnormality.  Ethmoid cells sinusitis.   Electronically Signed   By: Ted Mcalpine  M.D.   On: 06/22/2015 18:09   I have personally reviewed and evaluated these images and lab results as part of my medical decision-making.   EKG Interpretation None      MDM  Mr. Arp is a 49 yo male w/ PMHx of HTN, GERD, and varicose veins s/p removal x2 years ago presenting with RLE bleeding. No problems with hemorrhaging from varicose veins are past 2 years until yesterday. Experienced spontaneous bleeding from right lower extremity ulcer thought to be varicose vein. Patient applied pressure dressing with cessation of bleeding. Discharge 2 hours prior to arrival and bleeding reactivated. Patient attempted applying dressing fell over. Friend found on floor with large amount of blood surrounding. EMS called. Initial SBP 60/palp. Alert and oriented in route. Given 2L NS.  Middle-aged overweight male lying in stretcher. Alert and oriented 3.  Abdomen distended but nontender. Hernia noted to supraumbilical region. Heart rate 70s and 80s, SCP initially 90s/50s, following fluid chest dictation BP rechecked at 120s/60s, 2+ DP pulse on left, biphasic dopplerable pulse on left for removal pressure dressing and palpable DP pulse following overall pressure dressing.  Pressure dressing removed and no obvious bleeding. Dressing replaced with procoagulant gauze and lightly covered with Coban. Patient monitored in the ED for several hours without repeat bleeding. I-STAT lactic acid 3.89. I-STAT chem 8 notable for BUN/creatinine of 14/1.3. Hemoglobin 11.8 (decreased from baseline of 15). Patient had been given 2 L normal saline via EMS and received an additional 2 L normal saline in the emergency department. Repeat lactic acid 1.8. CT head showing no acute intracranial abnormality.  Given stability in the emergency department patient was discharged home in stable condition. Will follow up with PCP or vascular surgeon to discuss further intervention necessary and will get repeat hemoglobin in next 2-3 days to  ensure no further decline. Strict ED return precautions discussed. Patient understands and agrees with plan has no further questions or concerns at this time.  Patient care discussed with followed by my attending, Dr. Blane Ohara   Final diagnoses:  Varicose veins of lower extremities with complications, right  Hemorrhage of varicose veins of lower extremity, right    Angelina Ok, MD 06/23/15 [id number]  Blane Ohara, MD 06/26/15 1259

## 2015-06-22 NOTE — Discharge Instructions (Signed)
Bleeding Varicose Veins °Varicose veins are veins that have become enlarged and twisted. Valves in the veins help return blood from the leg to the heart. If these valves are damaged, blood flows backwards and backs up into the veins in the leg near the skin. This causes the veins to become larger because of increased pressure within. Sometimes these veins bleed. °CAUSES  °Factors that can lead to bleeding varicose veins include: °· Thinning of the skin that covers the veins. This skin is stretched as the veins enlarge. °· Weak and thinning walls of the varicose veins. These thin walls are part of the reason why blood is not flowing normally to the heart. °· Having high pressure in the veins. This high pressure occurs because the blood is not flowing freely back up to the heart. °· Injury. Even a small injury to a varicose vein can cause bleeding. °· Open wounds. A sore may develop near a varicose vein and not heal. This makes bleeding more likely. °· Taking medicine that thins the blood. These medicines may include aspirin, anti-inflammatory medicine, and other blood thinners. °SYMPTOMS  °If bleeding is on the outside surface of the skin, blood can be seen. Sometimes, the bleeding stays under the skin. If this happens, the blue or purple area will spread beyond the vein. This discoloration may be visible. °DIAGNOSIS  °To decide if you have a bleeding varicose vein, your caregiver may: °· Ask about your symptoms. This will include when you first saw bleeding. °· Ask about how long you have had varicose veins and if they cause you problems. °· Ask about your overall health. °· Ask about possible causes, like recent cuts or if the area near the varicose veins was bumped or injured. °· Examine the skin or leg that concerns you. Your caregiver will probably feel the veins. °· Order imaging tests. These create detailed pictures of the veins. °TREATMENT  °The first goal of treating bleeding varicose veins is to stop the  bleeding. Then, the aim is to keep any bleeding from happening again. Treatment will depend on the cause of the bleeding and how bad it is. Ask your caregiver about what would be best for you. Options include: °· Raising (elevating) your leg. Lie down with your leg propped up on a pillow or cushion. Your foot should be above your heart. °· Applying pressure to the spot that is bleeding. The bleeding should stop in a short time. °· Wearing elastic stocking that "compress" your legs (compression stockings). An elastic bandage may do the same thing. °· Applying an antibiotic cream on sores that are not healing. °· Surgically removing or closing off the bleeding varicose veins. °HOME CARE INSTRUCTIONS  °· Apply any creams that your caregiver prescribed. Follow the directions carefully. °· Wear compression stockings or any special wraps that were prescribed. Make sure you know: °¨ If you should wear them every day. °¨ How long you should wear them. °· If veins were removed or closed, a bandage (dressing) will probably cover the area. Make sure you know: °¨ How often the dressing should be changed. °¨ Whether the area can get wet. °¨ When you can leave the skin uncovered. °· Check your skin every day. Look for new sores and signs of bleeding. °· To prevent future bleeding: °¨ Use extra care in situations where you could cut your legs. Shaving, for example, or working outside in the garden. °¨ Try to keep your legs elevated as much as possible. Lie down   when you can. °SEEK MEDICAL CARE IF:  °· You have any questions about how to wear compression stockings or elastic bandages. °· Your veins continue to bleed. °· Sores develop near your varicose veins. °· You have a sore that does not heal or gets bigger. °· Pain increases in your leg. °· The area around a varicose vein becomes warm, red, or tender to the touch. °· You notice a yellowish fluid that smells bad coming from a spot where there was bleeding. °· You develop a  fever of more than 100.5° F (38.1° C). °SEEK IMMEDIATE MEDICAL CARE IF:  °· You develop a fever of more than 102° F (38.9° C). °Document Released: 02/06/2009 Document Revised: 12/13/2011 Document Reviewed: 01/22/2014 °[employer]® Patient Information ©2015 [employer]. This information is not intended to replace advice given to you by your health care provider. Make sure you discuss any questions you have with your health care provider. ° °

## 2015-06-22 NOTE — ED Notes (Signed)
Patient transported to CT 

## 2015-06-22 NOTE — ED Notes (Signed)
Patients glasses given to son.

## 2015-06-22 NOTE — ED Notes (Signed)
Per EMS, pt had a varicose vein bust on his right lower leg today at home. Pt had the same thing happen yesterday but was able to have his friend apply a pressure dressing and get to stop. Pt was taking a shower today and it reopened the pt had approximately of blood loss. Pts initial pressure was 64 systolic and 72 systolic after 2L of normal saline. Pt alert upon arrival to the ED. Bleeding controlled at this time.

## 2015-06-23 ENCOUNTER — Other Ambulatory Visit: Payer: Self-pay | Admitting: *Deleted

## 2015-06-23 DIAGNOSIS — I83811 Varicose veins of right lower extremities with pain: Secondary | ICD-10-CM

## 2015-06-25 ENCOUNTER — Encounter: Payer: Self-pay | Admitting: Vascular Surgery

## 2015-06-26 ENCOUNTER — Ambulatory Visit (HOSPITAL_COMMUNITY)
Admission: RE | Admit: 2015-06-26 | Discharge: 2015-06-26 | Disposition: A | Discharge status: Home / Self Care | Payer: 59 | Source: Ambulatory Visit | Attending: Vascular Surgery | Admitting: Vascular Surgery

## 2015-06-26 DIAGNOSIS — I83811 Varicose veins of right lower extremities with pain: Secondary | ICD-10-CM | POA: Insufficient documentation

## 2015-06-30 ENCOUNTER — Ambulatory Visit (INDEPENDENT_AMBULATORY_CARE_PROVIDER_SITE_OTHER): Payer: 59 | Admitting: Vascular Surgery

## 2015-06-30 ENCOUNTER — Encounter: Payer: Self-pay | Admitting: Vascular Surgery

## 2015-06-30 VITALS — BP 139/80 | HR 69 | Temp 97.8°F | Resp 16 | Ht 72.0 in | Wt 262.0 lb

## 2015-06-30 DIAGNOSIS — I83811 Varicose veins of right lower extremities with pain: Secondary | ICD-10-CM

## 2015-06-30 DIAGNOSIS — I83891 Varicose veins of right lower extremities with other complications: Secondary | ICD-10-CM

## 2015-06-30 NOTE — Progress Notes (Signed)
Subjective:     Patient ID: Joe Hayes, male   DOB: 02-Aug-1966, 49 y.o.   MRN: [id number]  HPI this 49 year old male seen today because of 2 episodes of bleeding from a varix in the right ankle 10 days ago. Patient is known to me having undergone laser ablation of the right great saphenous vein in 2014 for bleeding from a lateral varix. He has had no recurrent bleeding from that area. He had had no problems until 10 days ago when he developed spontaneous bleeding from a varicosity in the right medial ankle. This required a trip to the emergency department with a compression dressing and eventually this did stop. He has no history of DVT thrombophlebitis stasis ulcers.  Past Medical History  Diagnosis Date  . Abdominal pain   . Recurrent upper respiratory infection (URI)     uri x 1 month- states improved  . GERD (gastroesophageal reflux disease)   . Hypertension     was on samples years ago- not diagnosed as hypertension  . Depression     states PTSD 4 years ago requiring counceling and states is doing ok  . Umbilical hernia without mention of obstruction or gangrene     Social History  Substance Use Topics  . Smoking status: Current Some Day Smoker -- 0.50 packs/day for 10 years    Types: Cigarettes  . Smokeless tobacco: Never Used  . Alcohol Use: 4.8 oz/week    8 Shots of liquor per week    Family History  Problem Relation Age of Onset  . Heart disease Father   . Cancer Paternal Uncle     pt unaware of what kind    Allergies  Allergen Reactions  . Penicillins Hives    Occurred in childhood.     Current outpatient prescriptions:  .  HYDROcodone-acetaminophen (VICODIN) 5-500 MG per tablet, Ad lib., Disp: , Rfl:  .  meloxicam (MOBIC) 15 MG tablet, Take 1 tablet (15 mg total) by mouth daily., Disp: 30 tablet, Rfl: 1  Filed Vitals:   06/30/15 1437  BP: 139/80  Pulse: 69  Temp: 97.8 F (36.6 C)  Resp: 16  Height: 6' (1.829 m)  Weight: 262 lb (118.842 kg)  SpO2:  100%    Body mass index is 35.53 kg/(m^2).           Review of Systems denies chest pain but does have mild dyspnea on exertion. Has history of GERD. No claudication or hemoptysis.     Objective:   Physical Exam BP 139/80 mmHg  Pulse 69  Temp(Src) 97.8 F (36.6 C)  Resp 16  Ht 6' (1.829 m)  Wt 262 lb (118.842 kg)  BMI 35.53 kg/m2  SpO2 100%  Gen. obese male no apparent distress alert and oriented 3 Lungs no rhonchi or wheezing Right lower extremity with open area near medial malleolus with underlying varix. Opening is about 2 mm in size. Hyperpigmentation lower third right leg. No active bleeding present time. 3+ dorsalis pedis pulse palpable.  Patient had a venous duplex exam performed in our office a few days ago which revealed the majority of the right great saphenous vein continues to be closed however below the knee there is an open saphenous vein up to 0.4 cm in diameter which has reflux although it is borderline. I independently observe this with the bedside sono site exam and there does appear to be rapid flow in that distal great saphenous vein in the right distal calf.  Assessment:     Spontaneous bleeding right ankle from varix which required a trip to the emergency department Remote history of laser ablation right great saphenous vein in the thigh-2014 which is mostly closed at the present time    Plan:     Patient needs sclerotherapy today which we will perform because of the urgency of this problem and the fact that he has been to the emergency department. If he should develop further recurrent episodes may need to consider laser ablation of the great saphenous vein in the calf

## 2015-06-30 NOTE — Progress Notes (Signed)
X=No results found for: HIV1RNAQUANT% Sotradecol administered with a 27g butterfly.  Patient received a total of 10cc.  Pt was seen in ER for spontaneous bleeding from a varix on the top of his right lower calf just above the foot. The vein bled for a second time after the ER visit and the patient applied a pressure dressing to stop the bleeding. Dr. Hart Rochester ordered sclerotherapy to close the vein that has been bleeding even though the procedure has not been precertified because of his visits to the ER and because it is medically necessary. Pt tolerated the procedure well. I applied some silvadene to the area as it looks like a small ulcer is developing. Telfa pad and 4 4X4's also applied to the area. A 20-30 mm HG compression stocking was applied and I gave the patient his post procedure instructions on how long to keep the stocking on, limitaitons exercise-wise, as well as what to do in the event that he ever has more bleeding and if the ulcer enlarges. Pt expressed understanding and gratitude. Will follow the pt prn.  Photos: No.  Compression stockings applied: Yes.  as well as an ace wrap.

## 2015-09-15 ENCOUNTER — Telehealth: Payer: Self-pay | Admitting: *Deleted

## 2015-09-15 NOTE — Telephone Encounter (Signed)
Pt called in reporting that he bled again today from a varix on the leg we previously treated. He has now bled three times since his visit in Sept. 2016. Conferred with Dr. Hart RochesterLawson who said he should come in and let us inject the vein again. Appointment made for tomorrow am.

## 2015-09-16 ENCOUNTER — Ambulatory Visit (INDEPENDENT_AMBULATORY_CARE_PROVIDER_SITE_OTHER): Payer: 59 | Admitting: *Deleted

## 2015-09-16 DIAGNOSIS — I83023 Varicose veins of left lower extremity with ulcer of ankle: Secondary | ICD-10-CM

## 2015-09-16 DIAGNOSIS — L97219 Non-pressure chronic ulcer of right calf with unspecified severity: Principal | ICD-10-CM

## 2015-09-16 DIAGNOSIS — I83012 Varicose veins of right lower extremity with ulcer of calf: Secondary | ICD-10-CM

## 2015-09-16 NOTE — Progress Notes (Signed)
X=.3% Sotradecol administered with a 27g butterfly.  Patient received a total of 2cc.  Pt called in saying he has bled 3X's from the same general area (new spots) since we last injected in 9/16. Injected a vessel that was next to the site. Easy access. Aplied 4X4's, stocking, more 4X4's and a small ace. There is a small ulcer above the bleed site. If he bleeds again or the ulcer enlarges, we should repeat the reflux study to see if the GSV has opened up more and is a candidate for laser again.  Photos: No.  Compression stockings applied: Yes.

## 2017-07-08 IMAGING — CT CT HEAD W/O CM
2 series · 16 of 30 positions shown, 20 images · non-contrast
Comparison: None.

CLINICAL DATA: Status post fall with hitting posterior head.

EXAM:
CT HEAD WITHOUT CONTRAST
TECHNIQUE: Contiguous axial images were obtained from the base of the skull
through the vertex without intravenous contrast.

[Series 201: head w/o, idose (1) · axial · non-contrast · 0.46mm/px · z∈[+70,+200]mm · 13 of 32 slices shown, 17 images]
[im 3/32  brain]
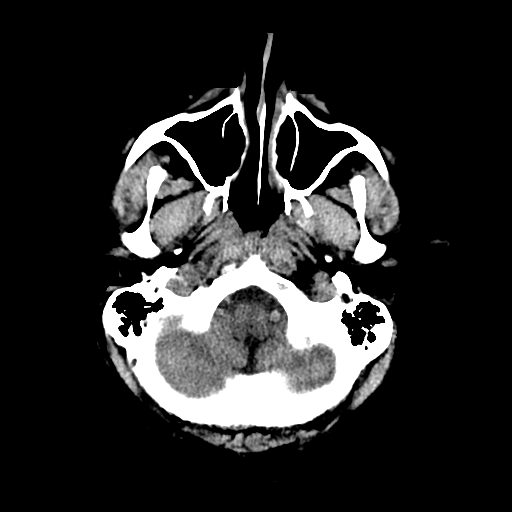
[im 3/32  bone]
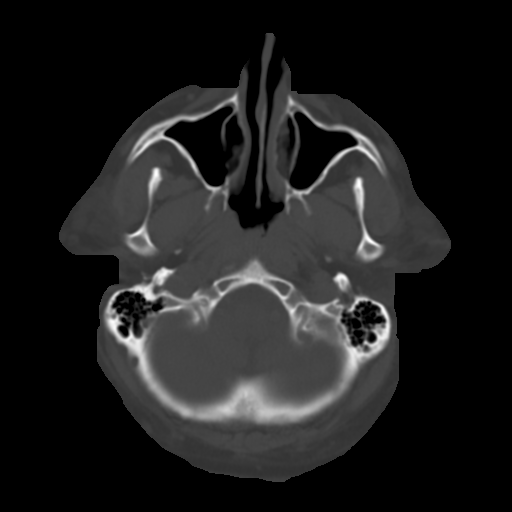
[im 5/32  brain]
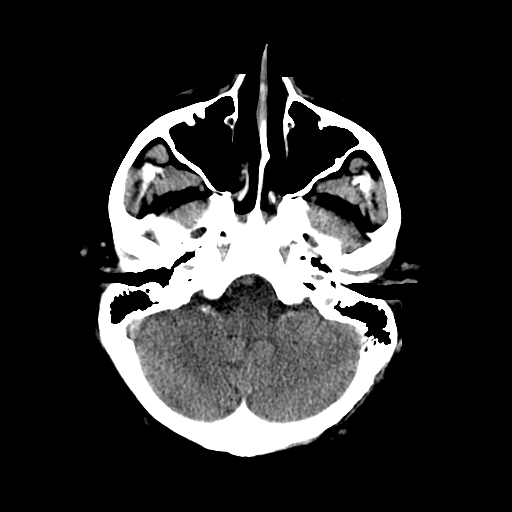
[im 7/32  brain]
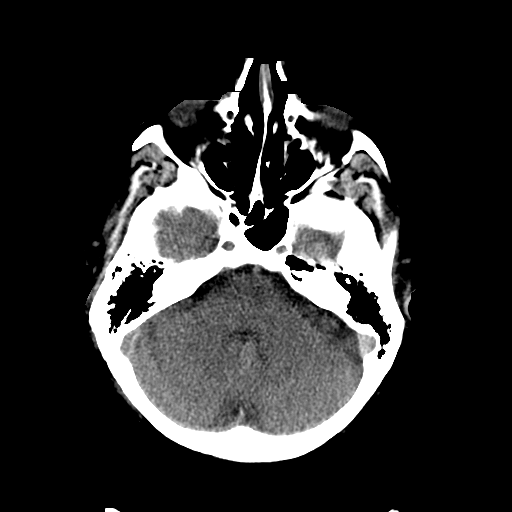
[im 9/32  brain]
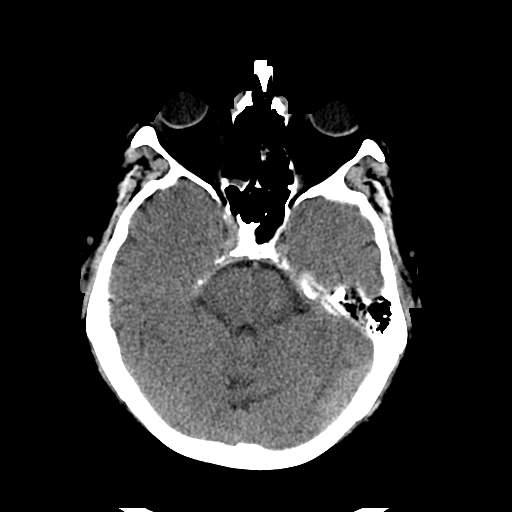
[im 12/32  brain]
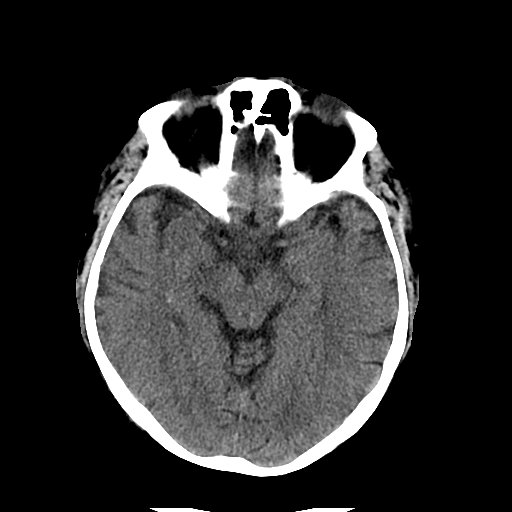
[im 12/32  bone]
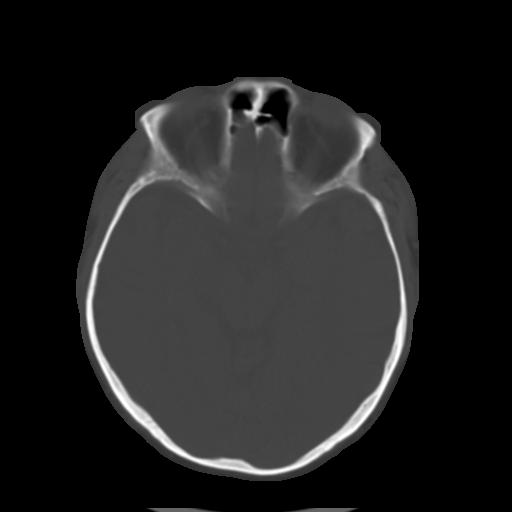
[im 14/32  brain]
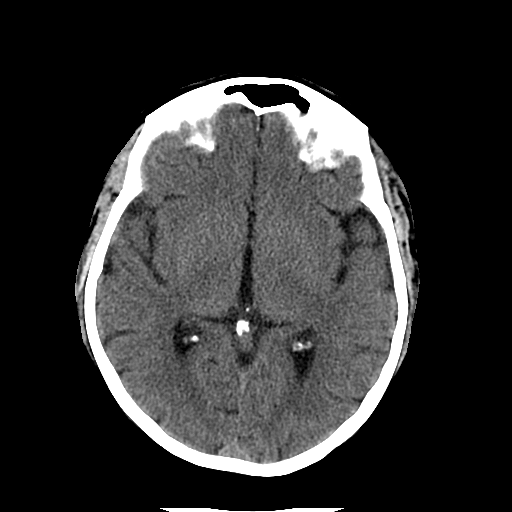
[im 16/32  brain]
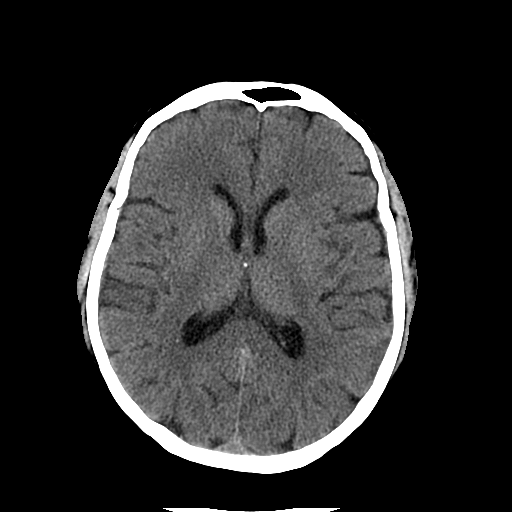
[im 18/32  brain]
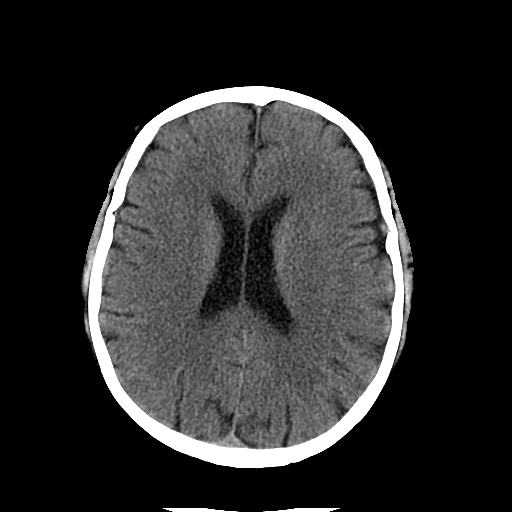
[im 20/32  brain]
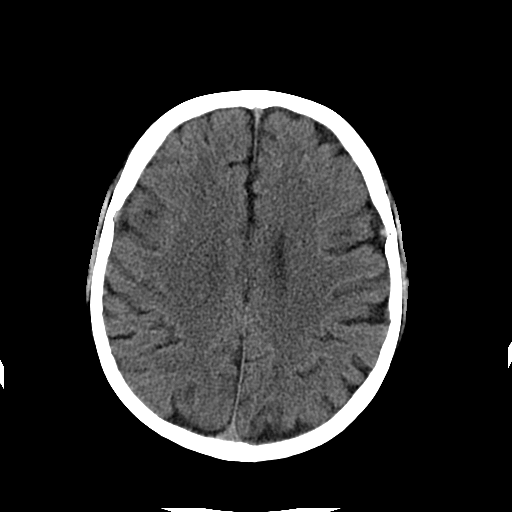
[im 20/32  bone]
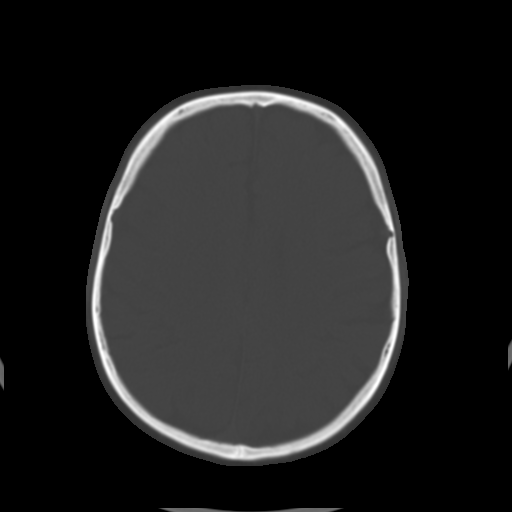
[im 23/32  brain]
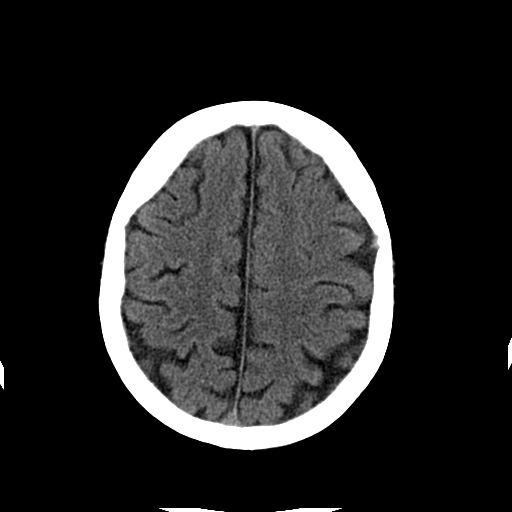
[im 25/32  brain]
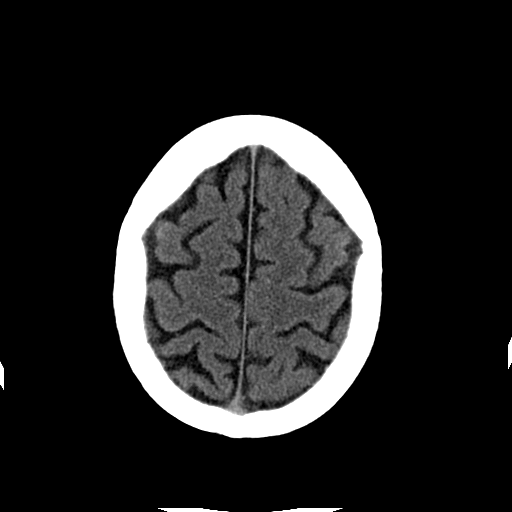
[im 27/32  brain]
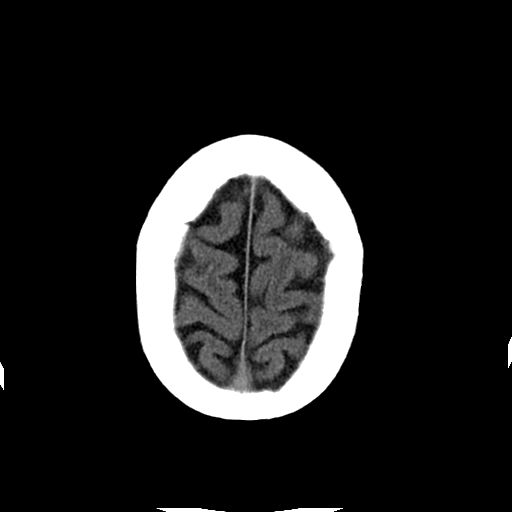
[im 29/32  brain]
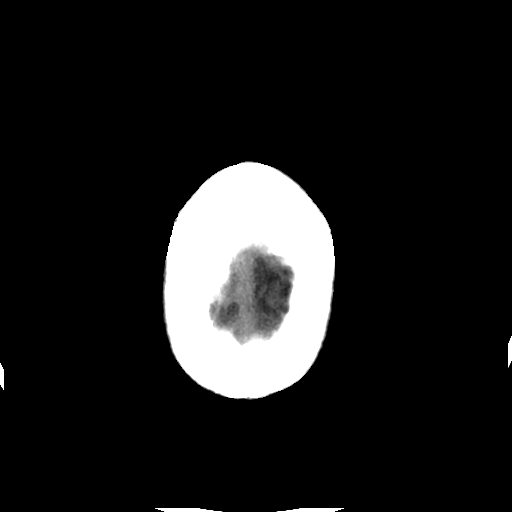
[im 29/32  bone]
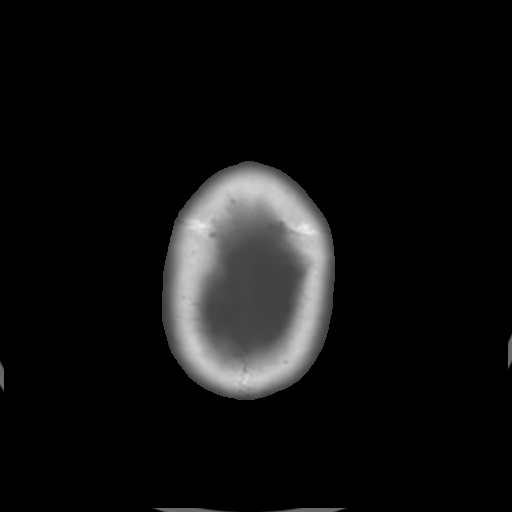

[Series 202: head w/o bone, idose (1) · axial · non-contrast · 0.46mm/px · z∈[+70,+115]mm · 3 of 32 slices shown]
[im 3/32  bone]
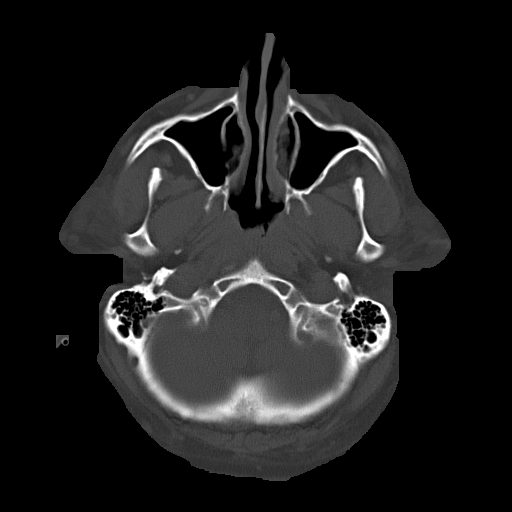
[im 7/32  bone]
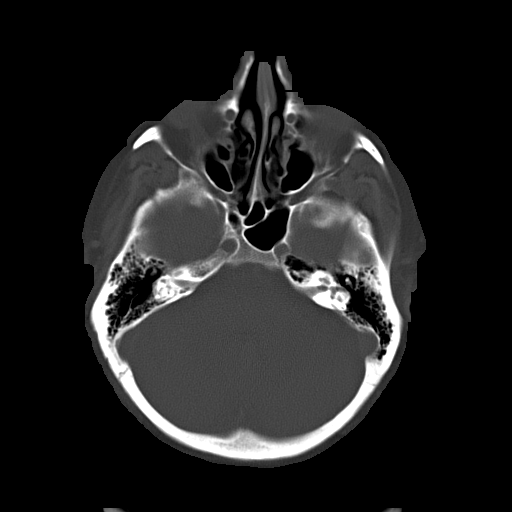
[im 12/32  bone]
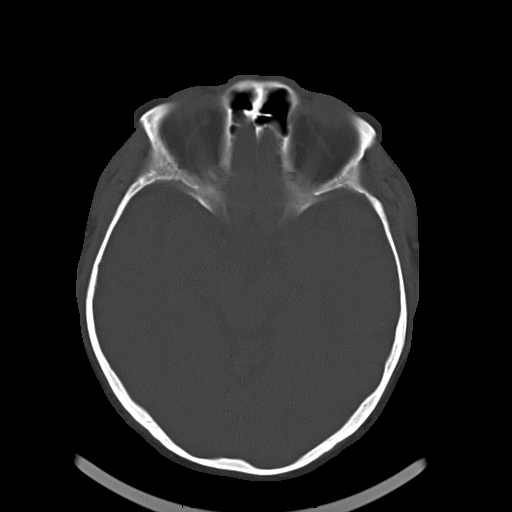

[16 of 30 positions shown; findings below may reference images not displayed]

FINDINGS: No mass effect or midline shift. No evidence of acute intracranial
hemorrhage, or infarction. No abnormal extra-axial fluid
collections. Gray-white matter differentiation is normal. Basal
cisterns are preserved. Vascular calcifications are noted at the
skullbase.

No depressed skull fractures. Partial opacification of the ethmoid
air cells.
IMPRESSION: No acute intracranial abnormality.

Ethmoid cells sinusitis.

## 2017-12-08 DIAGNOSIS — R609 Edema, unspecified: Secondary | ICD-10-CM | POA: Diagnosis not present

## 2017-12-08 DIAGNOSIS — F419 Anxiety disorder, unspecified: Secondary | ICD-10-CM | POA: Diagnosis not present

## 2017-12-08 DIAGNOSIS — Z79899 Other long term (current) drug therapy: Secondary | ICD-10-CM | POA: Diagnosis not present

## 2017-12-08 DIAGNOSIS — Z125 Encounter for screening for malignant neoplasm of prostate: Secondary | ICD-10-CM | POA: Diagnosis not present

## 2017-12-08 DIAGNOSIS — Z6836 Body mass index (BMI) 36.0-36.9, adult: Secondary | ICD-10-CM | POA: Diagnosis not present

## 2017-12-08 DIAGNOSIS — M25561 Pain in right knee: Secondary | ICD-10-CM | POA: Diagnosis not present

## 2017-12-13 DIAGNOSIS — R748 Abnormal levels of other serum enzymes: Secondary | ICD-10-CM | POA: Diagnosis not present

## 2017-12-13 DIAGNOSIS — R779 Abnormality of plasma protein, unspecified: Secondary | ICD-10-CM | POA: Diagnosis not present

## 2018-07-31 DIAGNOSIS — R609 Edema, unspecified: Secondary | ICD-10-CM | POA: Diagnosis not present

## 2018-07-31 DIAGNOSIS — K769 Liver disease, unspecified: Secondary | ICD-10-CM | POA: Diagnosis not present

## 2018-07-31 DIAGNOSIS — Z1331 Encounter for screening for depression: Secondary | ICD-10-CM | POA: Diagnosis not present

## 2018-07-31 DIAGNOSIS — I83899 Varicose veins of unspecified lower extremities with other complications: Secondary | ICD-10-CM | POA: Diagnosis not present

## 2018-07-31 DIAGNOSIS — Z1339 Encounter for screening examination for other mental health and behavioral disorders: Secondary | ICD-10-CM | POA: Diagnosis not present

## 2018-07-31 DIAGNOSIS — M2550 Pain in unspecified joint: Secondary | ICD-10-CM | POA: Diagnosis not present

## 2018-07-31 DIAGNOSIS — K432 Incisional hernia without obstruction or gangrene: Secondary | ICD-10-CM | POA: Diagnosis not present

## 2018-09-07 DIAGNOSIS — Z6833 Body mass index (BMI) 33.0-33.9, adult: Secondary | ICD-10-CM | POA: Diagnosis not present

## 2018-09-07 DIAGNOSIS — E669 Obesity, unspecified: Secondary | ICD-10-CM | POA: Diagnosis not present

## 2018-09-07 DIAGNOSIS — R5382 Chronic fatigue, unspecified: Secondary | ICD-10-CM | POA: Diagnosis not present

## 2018-09-07 DIAGNOSIS — M2550 Pain in unspecified joint: Secondary | ICD-10-CM | POA: Diagnosis not present

## 2019-01-30 DIAGNOSIS — B1920 Unspecified viral hepatitis C without hepatic coma: Secondary | ICD-10-CM | POA: Diagnosis not present

## 2019-01-30 DIAGNOSIS — K429 Umbilical hernia without obstruction or gangrene: Secondary | ICD-10-CM | POA: Diagnosis not present

## 2019-01-30 DIAGNOSIS — R609 Edema, unspecified: Secondary | ICD-10-CM | POA: Diagnosis not present

## 2019-01-30 DIAGNOSIS — K769 Liver disease, unspecified: Secondary | ICD-10-CM | POA: Diagnosis not present

## 2019-04-02 DIAGNOSIS — B182 Chronic viral hepatitis C: Secondary | ICD-10-CM | POA: Diagnosis not present

## 2019-04-02 DIAGNOSIS — R188 Other ascites: Secondary | ICD-10-CM | POA: Diagnosis not present

## 2019-04-10 ENCOUNTER — Other Ambulatory Visit: Payer: Self-pay | Admitting: Nurse Practitioner

## 2019-04-10 DIAGNOSIS — B182 Chronic viral hepatitis C: Secondary | ICD-10-CM

## 2019-04-11 DIAGNOSIS — Z79899 Other long term (current) drug therapy: Secondary | ICD-10-CM | POA: Diagnosis not present

## 2019-04-11 DIAGNOSIS — I83891 Varicose veins of right lower extremities with other complications: Secondary | ICD-10-CM | POA: Diagnosis not present

## 2019-04-11 DIAGNOSIS — J449 Chronic obstructive pulmonary disease, unspecified: Secondary | ICD-10-CM | POA: Diagnosis not present

## 2019-04-11 DIAGNOSIS — I509 Heart failure, unspecified: Secondary | ICD-10-CM | POA: Diagnosis not present

## 2019-04-11 DIAGNOSIS — I110 Hypertensive heart disease with heart failure: Secondary | ICD-10-CM | POA: Diagnosis not present

## 2019-04-11 DIAGNOSIS — Z880 Allergy status to penicillin: Secondary | ICD-10-CM | POA: Diagnosis not present

## 2019-04-11 DIAGNOSIS — F17210 Nicotine dependence, cigarettes, uncomplicated: Secondary | ICD-10-CM | POA: Diagnosis not present

## 2019-04-20 ENCOUNTER — Other Ambulatory Visit: Payer: 59

## 2019-05-01 ENCOUNTER — Other Ambulatory Visit: Payer: 59

## 2019-05-02 ENCOUNTER — Other Ambulatory Visit: Payer: 59

## 2019-05-03 ENCOUNTER — Other Ambulatory Visit: Payer: 59

## 2019-05-11 ENCOUNTER — Ambulatory Visit
Admission: RE | Admit: 2019-05-11 | Discharge: 2019-05-11 | Disposition: A | Discharge status: Home / Self Care | Payer: Medicare Other | Source: Ambulatory Visit | Attending: Nurse Practitioner | Admitting: Nurse Practitioner

## 2019-05-11 DIAGNOSIS — B182 Chronic viral hepatitis C: Secondary | ICD-10-CM

## 2019-06-04 DIAGNOSIS — B182 Chronic viral hepatitis C: Secondary | ICD-10-CM | POA: Diagnosis not present

## 2019-06-04 DIAGNOSIS — R188 Other ascites: Secondary | ICD-10-CM | POA: Diagnosis not present

## 2019-06-04 DIAGNOSIS — K7469 Other cirrhosis of liver: Secondary | ICD-10-CM | POA: Diagnosis not present

## 2019-06-05 ENCOUNTER — Encounter: Payer: Self-pay | Admitting: Gastroenterology

## 2019-07-10 ENCOUNTER — Ambulatory Visit: Payer: Medicare Other | Admitting: Gastroenterology

## 2019-07-10 NOTE — Progress Notes (Deleted)
   HPI :    Past Medical History:  Diagnosis Date  . Abdominal pain   . Chronic hepatitis C ([place])   . Cirrhosis of liver with ascites ([place])   . Depression    states PTSD 4 years ago requiring counceling and states is doing ok  . GERD (gastroesophageal reflux disease)   . Hypertension    was on samples years ago- not diagnosed as hypertension  . Recurrent upper respiratory infection (URI)    uri x 1 month- states improved  . Umbilical hernia without mention of obstruction or gangrene      Past Surgical History:  Procedure Laterality Date  . none     no surgeries  . UMBILICAL HERNIA REPAIR  08/12/2011   Procedure: HERNIA REPAIR UMBILICAL ADULT;  Surgeon: Willey Blade, MD;  Location: [facility];  Service: General;  Laterality: N/A;  repair umbilical hernia with mesh   Family History  Problem Relation Age of Onset  . Heart disease Father   . Cirrhosis Father   . Heart disease Mother   . Arthritis Mother   . Dementia Mother   . Cancer Paternal Uncle        pt unaware of what kind  . Cancer Sister   . Diabetes Sister   . HIV Brother    Social History   Tobacco Use  . Smoking status: Current Some Day Smoker    Packs/day: 0.50    Years: 10.00    Pack years: 5.00    Types: Cigarettes  . Smokeless tobacco: Never Used  Substance Use Topics  . Alcohol use: Yes    Alcohol/week: 8.0 standard drinks    Types: 8 Shots of liquor per week  . Drug use: No   Current Outpatient Medications  Medication Sig Dispense Refill  . HYDROcodone-acetaminophen (VICODIN) 5-500 MG per tablet Ad lib.    . meloxicam (MOBIC) 15 MG tablet Take 1 tablet (15 mg total) by mouth daily. 30 tablet 1   No current facility-administered medications for this visit.    Allergies  Allergen Reactions  . Penicillins Hives    Occurred in childhood.     Review of Systems: All systems reviewed and negative except where noted in HPI.    No results found.  Physical Exam: There were no vitals  taken for this visit. Constitutional: Pleasant,well-developed, ***male in no acute distress. HEENT: Normocephalic and atraumatic. Conjunctivae are normal. No scleral icterus. Neck supple.  Cardiovascular: Normal rate, regular rhythm.  Pulmonary/chest: Effort normal and breath sounds normal. No wheezing, rales or rhonchi. Abdominal: Soft, nondistended, nontender. Bowel sounds active throughout. There are no masses palpable. No hepatomegaly. Extremities: no edema Lymphadenopathy: No cervical adenopathy noted. Neurological: Alert and oriented to person place and time. Skin: Skin is warm and dry. No rashes noted. Psychiatric: Normal mood and affect. Behavior is normal.   ASSESSMENT AND PLAN:  [place], [place]

## 2019-08-01 DIAGNOSIS — B182 Chronic viral hepatitis C: Secondary | ICD-10-CM | POA: Diagnosis not present

## 2019-08-01 DIAGNOSIS — K7469 Other cirrhosis of liver: Secondary | ICD-10-CM | POA: Diagnosis not present

## 2019-08-01 DIAGNOSIS — R2990 Unspecified symptoms and signs involving the nervous system: Secondary | ICD-10-CM | POA: Diagnosis not present

## 2019-08-09 ENCOUNTER — Encounter: Payer: Self-pay | Admitting: Nurse Practitioner

## 2019-11-01 DIAGNOSIS — B182 Chronic viral hepatitis C: Secondary | ICD-10-CM | POA: Diagnosis not present

## 2019-11-01 DIAGNOSIS — K7469 Other cirrhosis of liver: Secondary | ICD-10-CM | POA: Diagnosis not present

## 2019-11-13 ENCOUNTER — Other Ambulatory Visit: Payer: Self-pay | Admitting: Nurse Practitioner

## 2019-11-13 DIAGNOSIS — K7469 Other cirrhosis of liver: Secondary | ICD-10-CM

## 2019-11-30 ENCOUNTER — Other Ambulatory Visit: Payer: Medicare Other

## 2021-05-27 IMAGING — US US ABDOMEN COMPLETE WITH ELASTOGRAPHY
1 series · 12 of 24 positions shown · non-contrast
Comparison: 12/21/2017

CLINICAL DATA: Chronic hepatitis-C without hepatic coma.



[Series 1: us abdomen complete with elastography · 0.15mm/px · 12 of 24 slices shown]
[im 2/24]
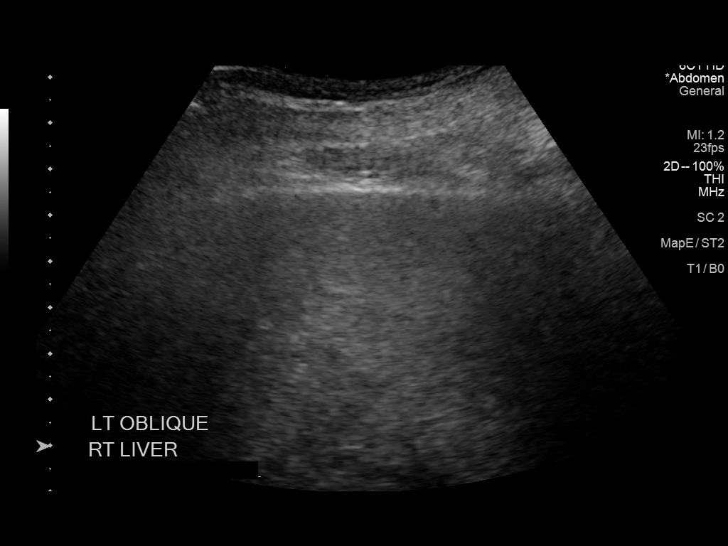
[im 4/24]
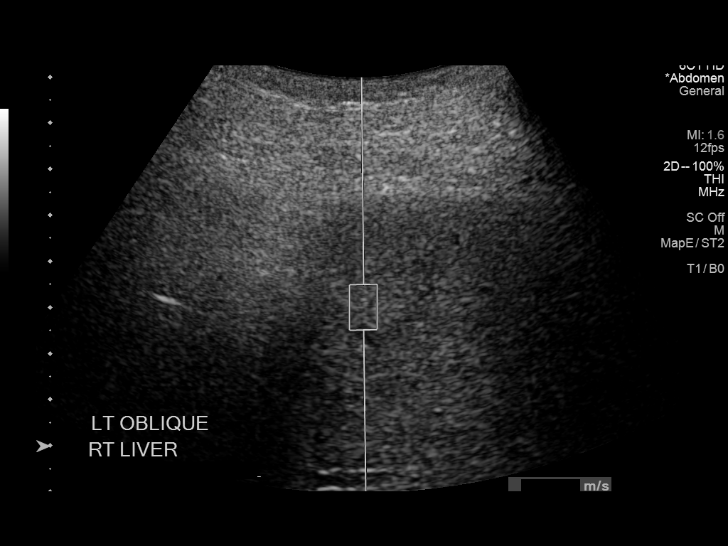
[im 6/24]
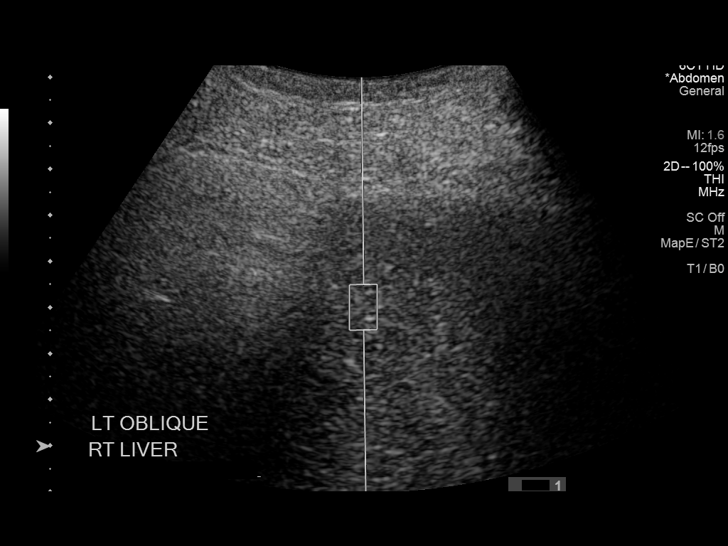
[im 8/24]
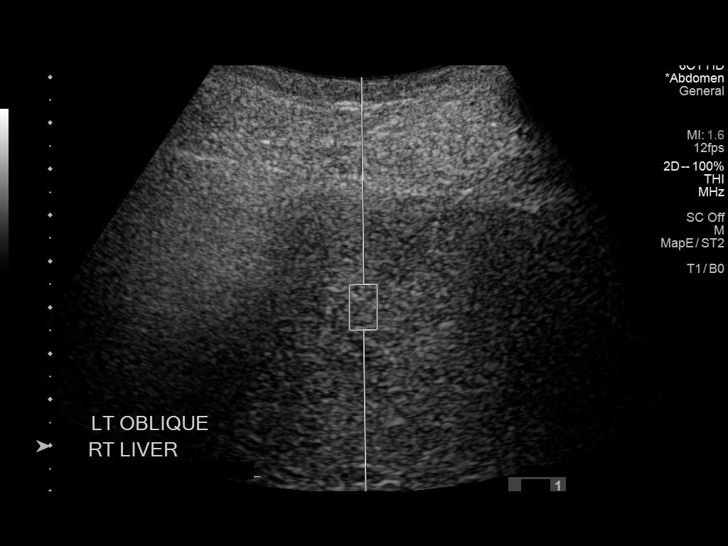
[im 10/24]
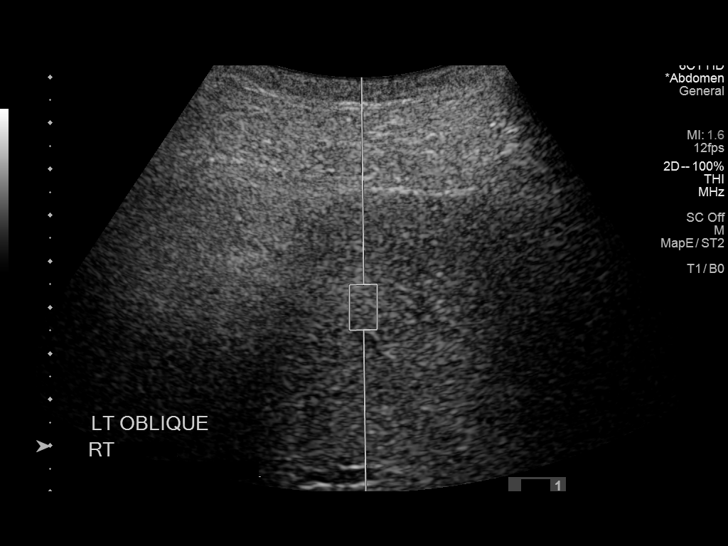
[im 12/24]
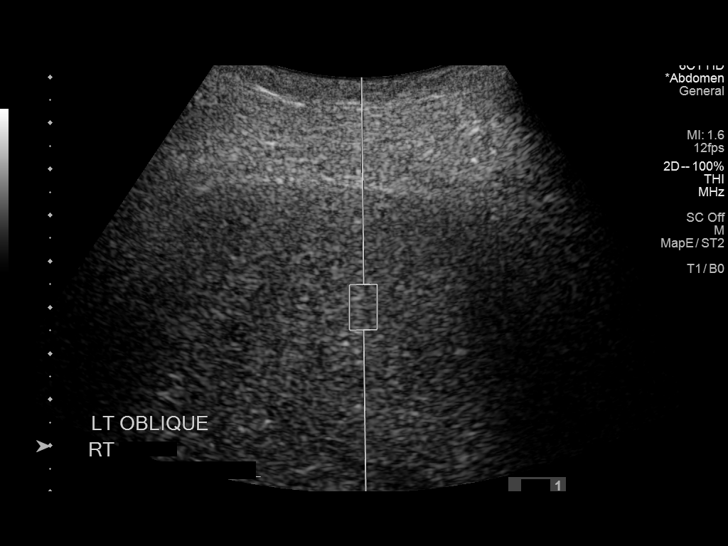
[im 14/24]
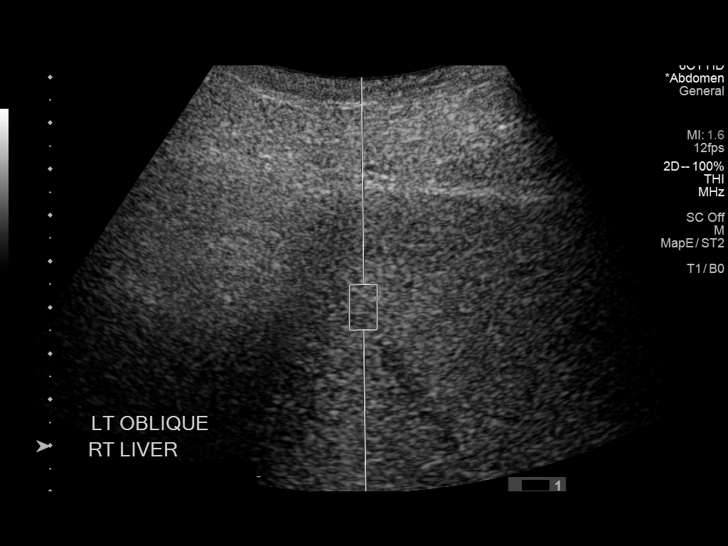
[im 16/24]
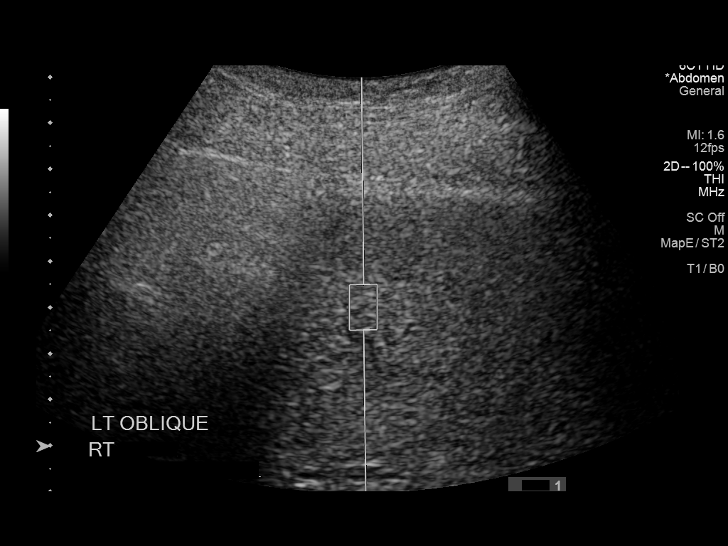
[im 18/24]
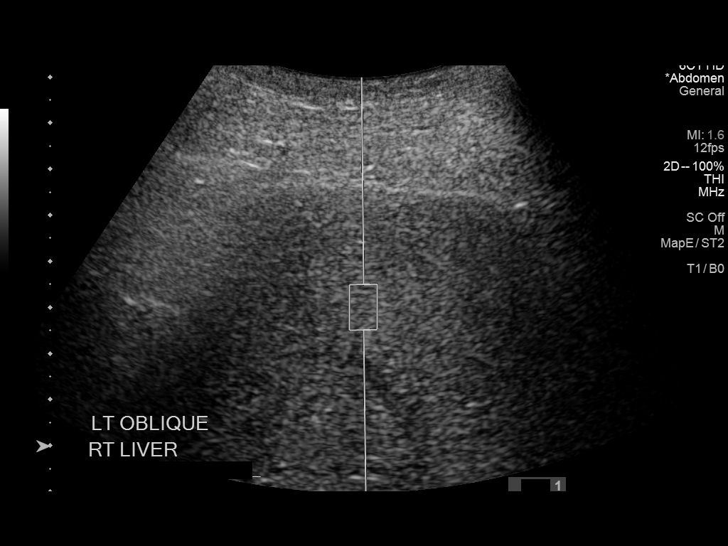
[im 20/24]
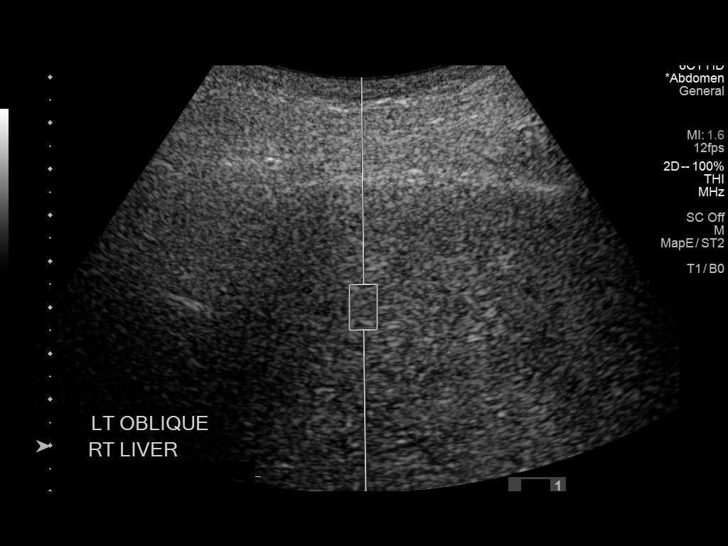
[im 22/24]
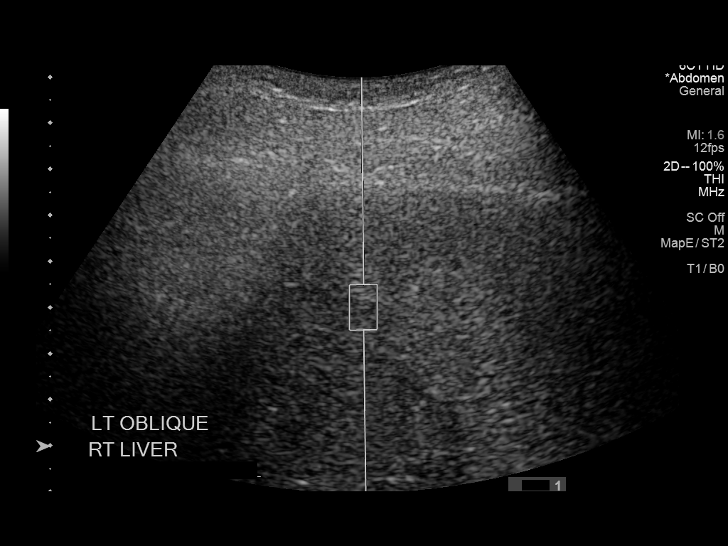
[im 24/24]
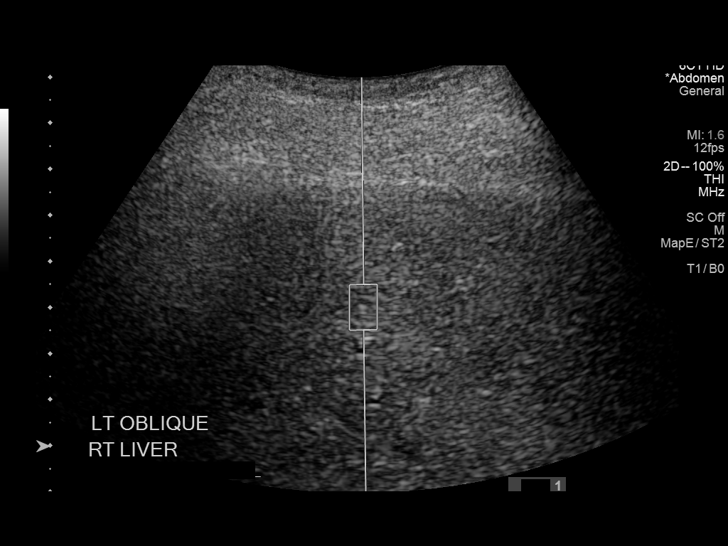

[12 of 24 positions shown; findings below may reference images not displayed]

FINDINGS: ULTRASOUND ABDOMEN

Gallbladder: No gallstones or wall thickening visualized. No
sonographic Murphy sign noted by sonographer. Comet tail artifact
seen arising from gallbladder wall, consistent with adenomyomatosis.
A 4 mm nonmobile gallbladder polyp is noted.

Common bile duct: Diameter: 4 mm, within normal limits.

Liver: Diffusely heterogeneous increased parenchymal echotexture is
seen, consistent with diffuse hepatocellular disease. No hepatic
mass identified. Minimal perihepatic ascites noted. Portal vein is
patent on color Doppler imaging with normal direction of blood flow
towards the liver.

IVC: No abnormality visualized.

Pancreas: Visualized portion unremarkable.

Spleen: Mild to moderate splenomegaly with calculated volume of 961
mL, consistent with portal venous hypertension.

Right Kidney: Length: 14.0 cm. Echogenicity within normal limits. No
mass or hydronephrosis visualized.

Left Kidney: Length: 12.0 cm. Echogenicity within normal limits. A 1
cm benign-appearing subcapsular cyst noted. No mass or
hydronephrosis visualized.

Abdominal aorta: Not visualized due to overlying bowel gas.

Other findings: None.

ULTRASOUND HEPATIC ELASTOGRAPHY

Device: Siemens Helix VTQ

Patient position: Oblique

Transducer [id number]

Number of measurements: 10

Hepatic segment:  8

Median velocity:   2.22 m/sec

IQR:

IQR/Median velocity ratio:

Corresponding Metavir fibrosis score:  Some F3 + F4

Risk of fibrosis: High

Limitations of exam: None

Please note that abnormal shear wave velocities may also be
identified in clinical settings other than with hepatic fibrosis,
such as: acute hepatitis, elevated right heart and central venous
pressures including use of beta blockers, Grandson disease
(Ana Aslan Wad), infiltrative processes such as
mastocytosis/amyloidosis/infiltrative tumor, extrahepatic
cholestasis, in the post-prandial state, and liver transplantation.
Correlation with patient history, laboratory data, and clinical
condition recommended.
IMPRESSION: ULTRASOUND ABDOMEN:

Diffuse hepatocellular disease and mild perihepatic ascites. No
hepatic mass visualized.

Mild-to-moderate splenomegaly, consistent with portal venous
hypertension.

ULTRASOUND HEPATIC ELASTOGRAPHY:

Median hepatic shear wave velocity is calculated at 2.22 m/sec.

Corresponding Metavir fibrosis score is Some F3 + F4.

Risk of fibrosis is High.

Follow-up: Follow up advised

## 2021-05-27 IMAGING — US US ABDOMEN COMPLETE WITH ELASTOGRAPHY
1 series · 12 of 25 positions shown · non-contrast
Comparison: 12/21/2017

CLINICAL DATA: Chronic hepatitis-C without hepatic coma.



[Series 1: us abdomen complete with elastography · 0.20mm/px · 12 of 97 slices shown]
[im 5/97]
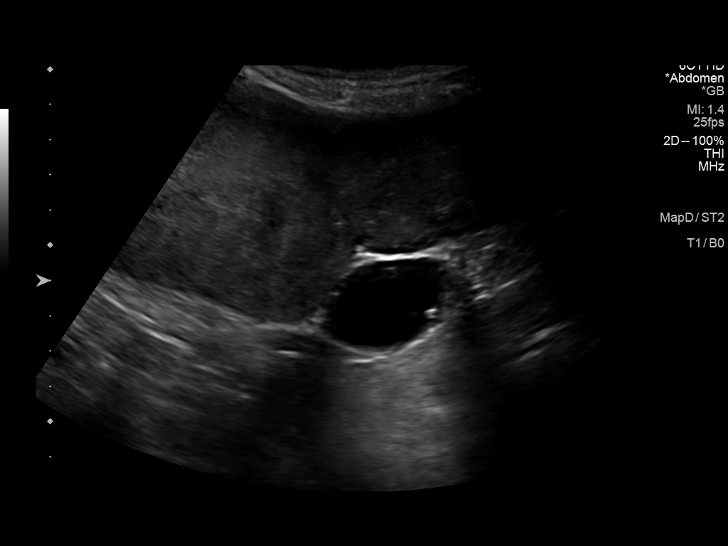
[im 13/97]
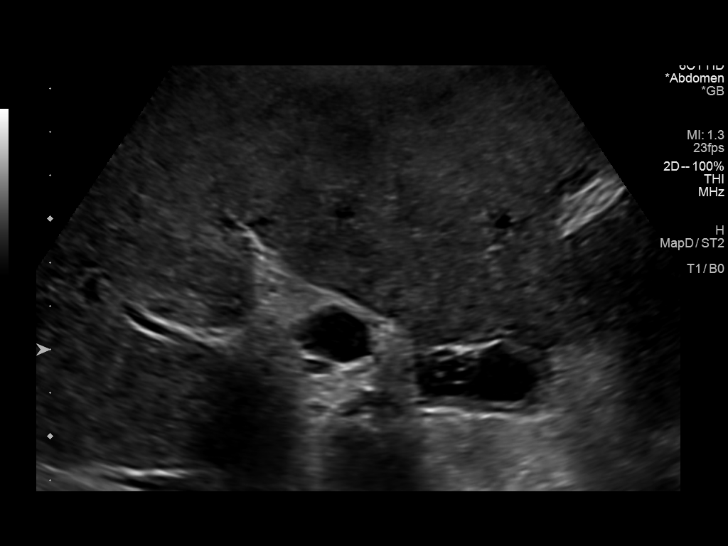
[im 21/97]
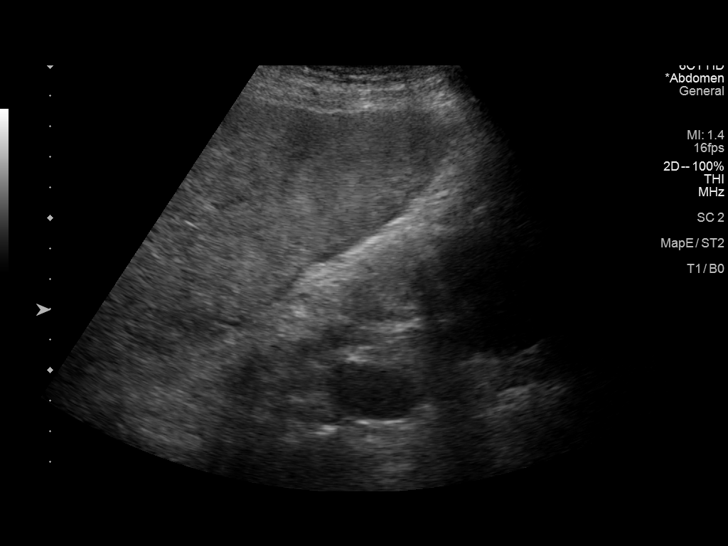
[im 29/97]
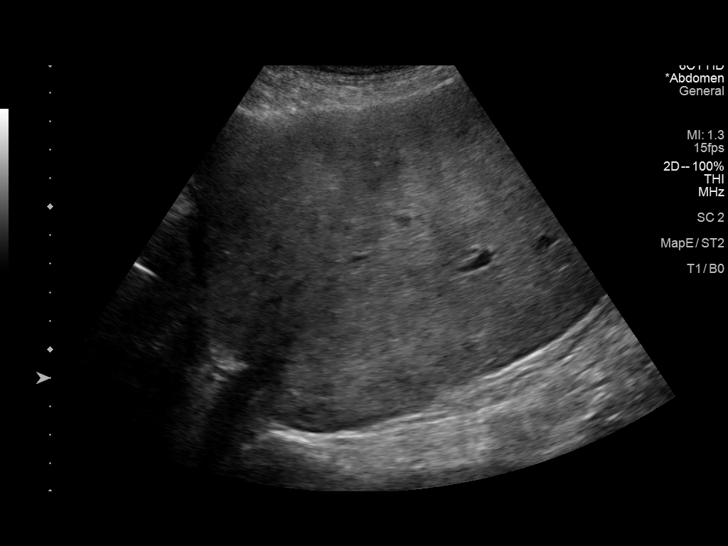
[im 37/97]
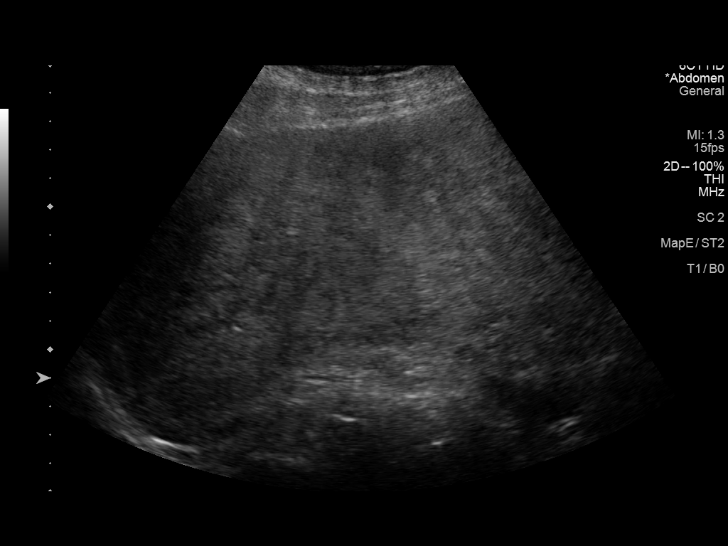
[im 45/97]
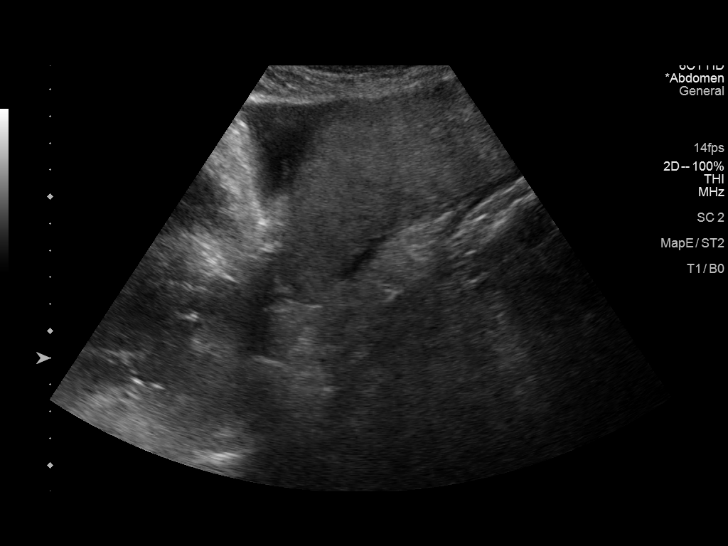
[im 53/97]
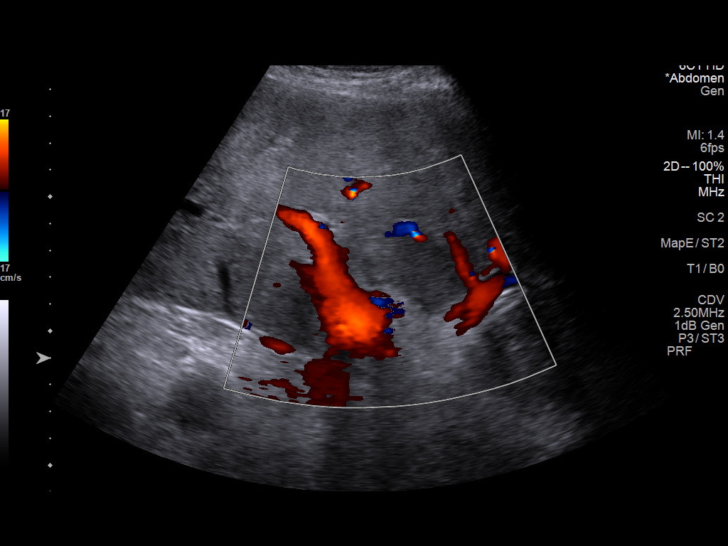
[im 61/97]
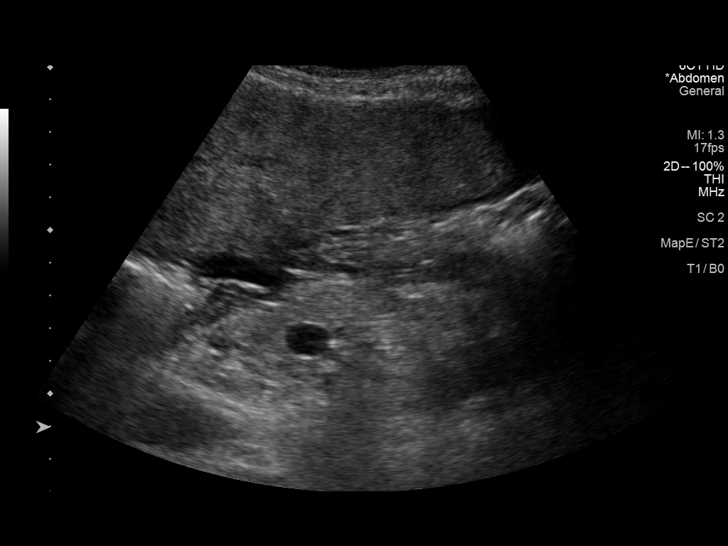
[im 69/97]
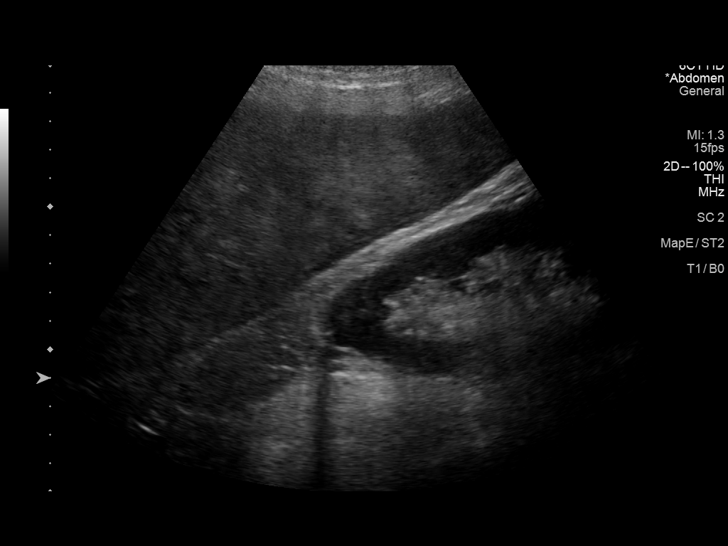
[im 77/97]
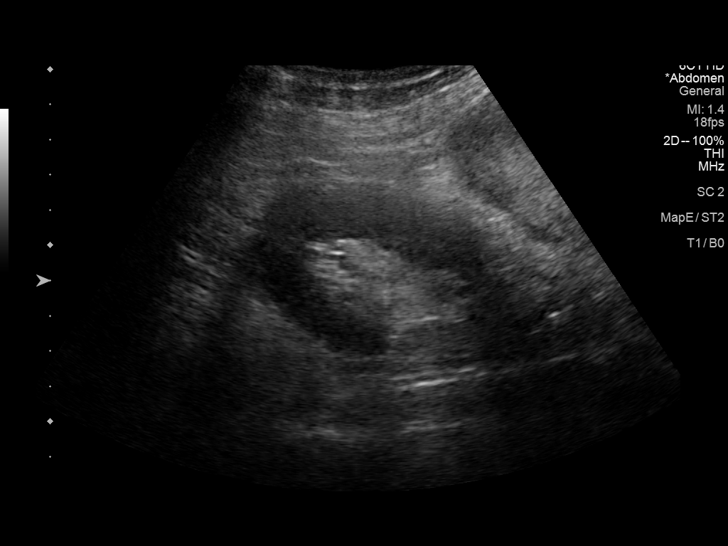
[im 85/97]
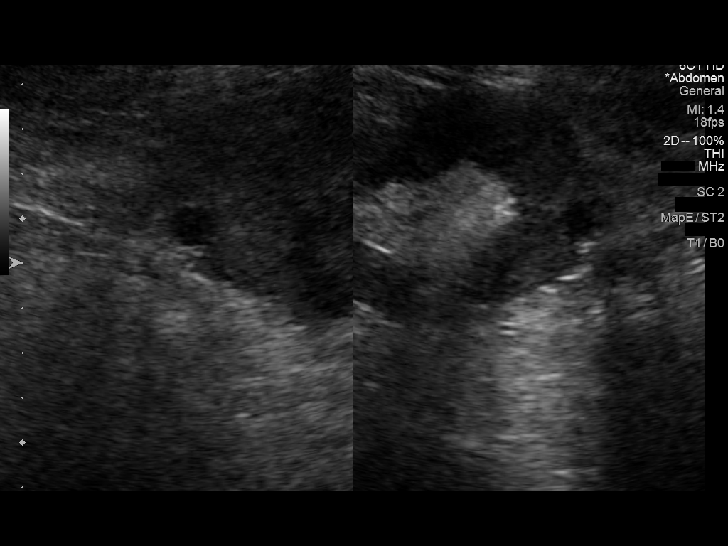
[im 93/97]
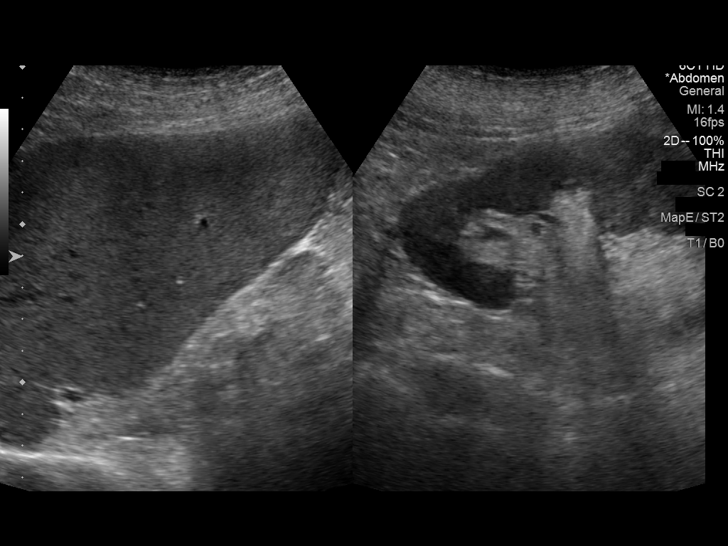

[12 of 25 positions shown; findings below may reference images not displayed]

FINDINGS: ULTRASOUND ABDOMEN

Gallbladder: No gallstones or wall thickening visualized. No
sonographic Murphy sign noted by sonographer. Comet tail artifact
seen arising from gallbladder wall, consistent with adenomyomatosis.
A 4 mm nonmobile gallbladder polyp is noted.

Common bile duct: Diameter: 4 mm, within normal limits.

Liver: Diffusely heterogeneous increased parenchymal echotexture is
seen, consistent with diffuse hepatocellular disease. No hepatic
mass identified. Minimal perihepatic ascites noted. Portal vein is
patent on color Doppler imaging with normal direction of blood flow
towards the liver.

IVC: No abnormality visualized.

Pancreas: Visualized portion unremarkable.

Spleen: Mild to moderate splenomegaly with calculated volume of 961
mL, consistent with portal venous hypertension.

Right Kidney: Length: 14.0 cm. Echogenicity within normal limits. No
mass or hydronephrosis visualized.

Left Kidney: Length: 12.0 cm. Echogenicity within normal limits. A 1
cm benign-appearing subcapsular cyst noted. No mass or
hydronephrosis visualized.

Abdominal aorta: Not visualized due to overlying bowel gas.

Other findings: None.

ULTRASOUND HEPATIC ELASTOGRAPHY

Device: Siemens Helix VTQ

Patient position: Oblique

Transducer [id number]

Number of measurements: 10

Hepatic segment:  8

Median velocity:   2.22 m/sec

IQR:

IQR/Median velocity ratio:

Corresponding Metavir fibrosis score:  Some F3 + F4

Risk of fibrosis: High

Limitations of exam: None

Please note that abnormal shear wave velocities may also be
identified in clinical settings other than with hepatic fibrosis,
such as: acute hepatitis, elevated right heart and central venous
pressures including use of beta blockers, Grandson disease
(Ana Aslan Wad), infiltrative processes such as
mastocytosis/amyloidosis/infiltrative tumor, extrahepatic
cholestasis, in the post-prandial state, and liver transplantation.
Correlation with patient history, laboratory data, and clinical
condition recommended.
IMPRESSION: ULTRASOUND ABDOMEN:

Diffuse hepatocellular disease and mild perihepatic ascites. No
hepatic mass visualized.

Mild-to-moderate splenomegaly, consistent with portal venous
hypertension.

ULTRASOUND HEPATIC ELASTOGRAPHY:

Median hepatic shear wave velocity is calculated at 2.22 m/sec.

Corresponding Metavir fibrosis score is Some F3 + F4.

Risk of fibrosis is High.

Follow-up: Follow up advised

## 2021-06-04 DEATH — deceased
